# Patient Record
Sex: Male | Born: 1955 | Race: Black or African American | Hispanic: No | Marital: Single | Smoking: Former smoker
Health system: Southern US, Community
[De-identification: ages and names within clinical notes are randomized; demographics above are authoritative.]

## PROBLEM LIST (undated history)

## (undated) DIAGNOSIS — M545 Low back pain, unspecified: Secondary | ICD-10-CM

## (undated) DIAGNOSIS — G4730 Sleep apnea, unspecified: Secondary | ICD-10-CM

## (undated) DIAGNOSIS — I10 Essential (primary) hypertension: Secondary | ICD-10-CM

## (undated) DIAGNOSIS — E785 Hyperlipidemia, unspecified: Secondary | ICD-10-CM

## (undated) DIAGNOSIS — F329 Major depressive disorder, single episode, unspecified: Secondary | ICD-10-CM

## (undated) DIAGNOSIS — I714 Abdominal aortic aneurysm, without rupture, unspecified: Secondary | ICD-10-CM

## (undated) DIAGNOSIS — E0590 Thyrotoxicosis, unspecified without thyrotoxic crisis or storm: Secondary | ICD-10-CM

## (undated) DIAGNOSIS — M5126 Other intervertebral disc displacement, lumbar region: Secondary | ICD-10-CM

## (undated) HISTORY — DX: Abdominal aortic aneurysm, without rupture: I71.4

## (undated) HISTORY — PX: HERNIA REPAIR: SHX51

## (undated) HISTORY — DX: Low back pain, unspecified: M54.50

## (undated) HISTORY — DX: Essential (primary) hypertension: I10

## (undated) HISTORY — DX: Hyperlipidemia, unspecified: E78.5

## (undated) HISTORY — DX: Thyrotoxicosis, unspecified without thyrotoxic crisis or storm: E05.90

## (undated) HISTORY — DX: Low back pain: M54.5

## (undated) HISTORY — DX: Abdominal aortic aneurysm, without rupture, unspecified: I71.40

## (undated) HISTORY — DX: Major depressive disorder, single episode, unspecified: F32.9

---

## 1993-02-20 HISTORY — PX: HAND SURGERY: SHX662

## 2013-01-15 ENCOUNTER — Encounter: Payer: Self-pay | Admitting: Surgery

## 2013-02-12 ENCOUNTER — Encounter: Payer: Self-pay | Admitting: Surgery

## 2013-02-17 ENCOUNTER — Encounter: Payer: Self-pay | Admitting: Surgery

## 2013-02-17 ENCOUNTER — Ambulatory Visit (INDEPENDENT_AMBULATORY_CARE_PROVIDER_SITE_OTHER): Payer: PRIVATE HEALTH INSURANCE | Admitting: Surgery

## 2013-02-17 ENCOUNTER — Encounter (INDEPENDENT_AMBULATORY_CARE_PROVIDER_SITE_OTHER): Payer: Self-pay

## 2013-02-17 VITALS — BP 138/98 | HR 73 | Ht 72.0 in | Wt 218.3 lb

## 2013-02-17 DIAGNOSIS — I714 Abdominal aortic aneurysm, without rupture, unspecified: Secondary | ICD-10-CM

## 2013-02-17 NOTE — Progress Notes (Signed)
Patient name: Lawrence Sandoval MRN: [id number] DOB: 12-Feb-1956 Sex: male   Referred by: Dr. Venetia Night  Reason for referral:  Chief Complaint  Patient presents with  . AAA    new pt    HISTORY OF PRESENT ILLNESS: This is a very pleasant 57 year old gentleman who was referred today for evaluation of an abdominal aortic aneurysm.  This was initially detected on ultrasound during a workup for back pain.  Maximum diameter was 3.9 cm.  The patient does not endorse any abdominal pain.  The patient suffers from hypercholesterolemia and is managed with a statin.  He has hypertension which has not been well controlled because of his back pain.  He has a history of smoking but quit 10 years ago.  Past Medical History  Diagnosis Date  . Hyperlipidemia   . Hypertension   . Hyperthyroidism   . Lumbago   . Depression   . AAA (abdominal aortic aneurysm)     History reviewed. No pertinent past surgical history.  History   Social History  . Marital Status: Unknown    Spouse Name: N/A    Number of Children: N/A  . Years of Education: N/A   Occupational History  . Not on file.   Social History Main Topics  . Smoking status: Former Smoker -- 35 years    Types: Cigarettes    Quit date: 02/18/2003  . Smokeless tobacco: Never Used  . Alcohol Use: No     Comment: quit abusing alcohol about 10 years ago  . Drug Use: No  . Sexual Activity: Not on file   Other Topics Concern  . Not on file   Social History Narrative  . No narrative on file    Family History  Problem Relation Age of Onset  . Heart disease Mother   . Alcohol abuse Father   . Stroke Maternal Grandfather     Allergies as of 02/17/2013  . (No Known Allergies)    Current Outpatient Prescriptions on File Prior to Visit  Medication Sig Dispense Refill  . atenolol (TENORMIN) 50 MG tablet Take 50 mg by mouth daily.      . cetirizine (ZYRTEC) 10 MG tablet Take 10 mg by mouth daily.      . meloxicam (MOBIC) 7.5 MG  tablet Take 7.5 mg by mouth daily.      . methimazole (TAPAZOLE) 5 MG tablet Take 5 mg by mouth 3 (three) times daily.      . pravastatin (PRAVACHOL) 20 MG tablet Take 20 mg by mouth daily.       No current facility-administered medications on file prior to visit.     REVIEW OF SYSTEMS: Cardiovascular: No chest pain, chest pressure, palpitations, orthopnea, or dyspnea on exertion. No claudication or rest pain,  No history of DVT or phlebitis. Pulmonary: No productive cough, asthma or wheezing. Neurologic: Positive for numbness in his legs Hematologic: No bleeding problems or clotting disorders. Musculoskeletal: No joint pain or joint swelling. Gastrointestinal: No blood in stool or hematemesis Genitourinary: No dysuria or hematuria. Psychiatric:: No history of major depression. Integumentary: No rashes or ulcers. Constitutional: No fever or chills.  PHYSICAL EXAMINATION: General: The patient appears their stated age.  Vital signs are BP 138/98  Pulse 73  Ht 6' (1.829 m)  Wt 218 lb 4.8 oz (99.02 kg)  BMI 29.60 kg/m2  SpO2 98% HEENT:  No gross abnormalities Pulmonary: Respirations are non-labored Abdomen: Soft and non-tender  Musculoskeletal: There are no major deformities.  Neurologic: No focal weakness or paresthesias are detected, Skin: There are no ulcer or rashes noted. Psychiatric: The patient has normal affect. Cardiovascular: There is a regular rate and rhythm without significant murmur appreciated.  Extremities are warm and well perfused.  No carotid bruits.   Diagnostic Studies:  Outside ultrasound shows a 3.9 cm abdominal aortic aneurysm   Assessment:  Small abdominal aortic aneurysm Plan: I discussed the natural history of aneurysmal disease of the abdominal aorta with the patient today.  At this size, I think he is at very low risk for rupture.  I told him the importance of regular ultrasound surveillance.  He will come back to be evaluated again in my office  with an ultrasound in one year.     Jorge Ny, M.D. [facility] [place] Office: [phone] Pager:  [phone]

## 2013-02-17 NOTE — Addendum Note (Signed)
Addended by: Sharee Pimple on: 02/17/2013 02:40 PM   Modules accepted: Orders

## 2013-02-24 ENCOUNTER — Encounter: Payer: Self-pay | Admitting: Family Medicine

## 2014-02-17 ENCOUNTER — Encounter: Payer: Self-pay | Admitting: Surgery

## 2014-02-23 ENCOUNTER — Ambulatory Visit (INDEPENDENT_AMBULATORY_CARE_PROVIDER_SITE_OTHER): Payer: Medicare HMO | Admitting: Family

## 2014-02-23 ENCOUNTER — Ambulatory Visit (HOSPITAL_COMMUNITY)
Admission: RE | Admit: 2014-02-23 | Discharge: 2014-02-23 | Disposition: A | Discharge status: Home / Self Care | Payer: Medicare HMO | Source: Ambulatory Visit | Attending: Surgery | Admitting: Surgery

## 2014-02-23 ENCOUNTER — Encounter (INDEPENDENT_AMBULATORY_CARE_PROVIDER_SITE_OTHER): Payer: Self-pay

## 2014-02-23 ENCOUNTER — Encounter: Payer: Self-pay | Admitting: Family

## 2014-02-23 DIAGNOSIS — I714 Abdominal aortic aneurysm, without rupture, unspecified: Secondary | ICD-10-CM

## 2014-02-23 NOTE — Patient Instructions (Signed)
Abdominal Aortic Aneurysm An aneurysm is a weakened or damaged part of an artery wall that bulges from the normal force of blood pumping through the body. An abdominal aortic aneurysm is an aneurysm that occurs in the lower part of the aorta, the main artery of the body.  The major concern with an abdominal aortic aneurysm is that it can enlarge and burst (rupture) or blood can flow between the layers of the wall of the aorta through a tear (aorticdissection). Both of these conditions can cause bleeding inside the body and can be life threatening unless diagnosed and treated promptly. CAUSES  The exact cause of an abdominal aortic aneurysm is unknown. Some contributing factors are:   A hardening of the arteries caused by the buildup of fat and other substances in the lining of a blood vessel (arteriosclerosis).  Inflammation of the walls of an artery (arteritis).   Connective tissue diseases, such as Marfan syndrome.   Abdominal trauma.   An infection, such as syphilis or staphylococcus, in the wall of the aorta (infectious aortitis) caused by bacteria. RISK FACTORS  Risk factors that contribute to an abdominal aortic aneurysm may include:  Age older than 60 years.   High blood pressure (hypertension).  Male gender.  Ethnicity (white race).  Obesity.  Family history of aneurysm (first degree relatives only).  Tobacco use. PREVENTION  The following healthy lifestyle habits may help decrease your risk of abdominal aortic aneurysm:  Quitting smoking. Smoking can raise your blood pressure and cause arteriosclerosis.  Limiting or avoiding alcohol.  Keeping your blood pressure, blood sugar level, and cholesterol levels within normal limits.  Decreasing your salt intake. In somepeople, too much salt can raise blood pressure and increase your risk of abdominal aortic aneurysm.  Eating a diet low in saturated fats and cholesterol.  Increasing your fiber intake by including  whole grains, vegetables, and fruits in your diet. Eating these foods may help lower blood pressure.  Maintaining a healthy weight.  Staying physically active and exercising regularly. SYMPTOMS  The symptoms of abdominal aortic aneurysm may vary depending on the size and rate of growth of the aneurysm.Most grow slowly and do not have any symptoms. When symptoms do occur, they may include:  Pain (abdomen, side, lower back, or groin). The pain may vary in intensity. A sudden onset of severe pain may indicate that the aneurysm has ruptured.  Feeling full after eating only small amounts of food.  Nausea or vomiting or both.  Feeling a pulsating lump in the abdomen.  Feeling faint or passing out. DIAGNOSIS  Since most unruptured abdominal aortic aneurysms have no symptoms, they are often discovered during diagnostic exams for other conditions. An aneurysm may be found during the following procedures:  Ultrasonography (A one-time screening for abdominal aortic aneurysm by ultrasonography is also recommended for all men aged 65-75 years who have ever smoked).  X-ray exams.  A computed tomography (CT).  Magnetic resonance imaging (MRI).  Angiography or arteriography. TREATMENT  Treatment of an abdominal aortic aneurysm depends on the size of your aneurysm, your age, and risk factors for rupture. Medication to control blood pressure and pain may be used to manage aneurysms smaller than 6 cm. Regular monitoring for enlargement may be recommended by your caregiver if:  The aneurysm is 3-4 cm in size (an annual ultrasonography may be recommended).  The aneurysm is 4-4.5 cm in size (an ultrasonography every 6 months may be recommended).  The aneurysm is larger than 4.5 cm in   size (your caregiver may ask that you be examined by a vascular surgeon). If your aneurysm is larger than 6 cm, surgical repair may be recommended. There are two main methods for repair of an aneurysm:   Endovascular  repair (a minimally invasive surgery). This is done most often.  Open repair. This method is used if an endovascular repair is not possible. Document Released: 11/16/2004 Document Revised: 06/03/2012 Document Reviewed: 03/08/2012 Exit[employer] Patient Information 2015 [employer]. This information is not intended to replace advice given to you by your health care provider. Make sure you discuss any questions you have with your health care provider.  

## 2014-02-23 NOTE — Progress Notes (Signed)
[facility] [place]  Established Abdominal Aortic Aneurysm  History of Present Illness  Lawrence Sandoval is a 59 y.o. (01/28/1956) male who presents with chief complaint: follow up for AAA.  He was initially seen in December 2014 by Dr. Myra Gianotti  for evaluation of an abdominal aortic aneurysm. This was detected on ultrasound during a workup for back pain. Maximum diameter was 3.9 cm. The patient does not endorse any abdominal pain. 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 He has occasional chest and diaphragmatic area pain that is aggravated by lying down, relieved by sitting up. He states that he has had a cardiac work up and this was negative. He also states that he no longer takes meloxicam. He reports taking Nexium about 15 years ago, does not recall if it helped this pain; he will see his PCP on March 11, 2014  The patient suffers from hypercholesterolemia and is managed with a statin. He has hypertension which has not been well controlled. He has a history of smoking but quit in 2006.  The patient denies claudication in legs with walking, does have left leg radiculopathy.  The patient denies history of stroke or TIA symptoms.  Pt Diabetic: No Pt smoker: former smoker, quit about 2006 and stopped ETOH use at that time  Past Medical History  Diagnosis Date  . Hyperlipidemia   . Hypertension   . Hyperthyroidism   . Lumbago   . Depression   . AAA (abdominal aortic aneurysm)    Past Surgical History  Procedure Laterality Date  . Hand surgery Left 1995    Hand and Arm injury.    Social History History   Social History  . Marital Status: Unknown    Spouse Name: N/A    Number of Children: N/A  . Years of Education: N/A   Occupational History  . Not on file.   Social History Main Topics  . Smoking status: Former Smoker -- 35 years    Types: Cigarettes    Quit date: 02/18/2003  . Smokeless tobacco: Never Used  . Alcohol  Use: No     Comment: quit abusing alcohol about 10 years ago  . Drug Use: No  . Sexual Activity: Not on file   Other Topics Concern  . Not on file   Social History Narrative   Family History Family History  Problem Relation Age of Onset  . Heart disease Mother     CHF  . Alcohol abuse Father   . Hypertension Father   . Stroke Maternal Grandfather     Current Outpatient Prescriptions on File Prior to Visit  Medication Sig Dispense Refill  . atenolol (TENORMIN) 50 MG tablet Take 50 mg by mouth daily.    . cetirizine (ZYRTEC) 10 MG tablet Take 10 mg by mouth daily.    . methimazole (TAPAZOLE) 5 MG tablet Take 5 mg by mouth daily.     . pravastatin (PRAVACHOL) 20 MG tablet Take 20 mg by mouth daily.    . meloxicam (MOBIC) 7.5 MG tablet Take 7.5 mg by mouth daily.    . sertraline (ZOLOFT) 50 MG tablet Take 50 mg by mouth daily.      No current facility-administered medications on file prior to visit.   No Known Allergies  ROS: See HPI for pertinent positives and negatives.  Physical Examination  Filed Vitals:   02/23/14 0954  BP: 140/92  Pulse: 59  Resp: 16  Height: 6' (1.829 m)  Weight: 214 lb (97.07 kg)  SpO2: 98%   Body mass index is 29.02 kg/(m^2).  General: A&O x 3, WD.  Pulmonary: Sym exp, good air movt, CTAB, no rales, rhonchi, or wheezing.  Cardiac: RRR, Nl S1, S2, no detected murmur.   Carotid Bruits Right Left   Negative Negative   Aorta is not palpable Radial pulses are 2+ palpable.                          VASCULAR EXAM:                                                                                                         LE Pulses Right Left       FEMORAL  2+ palpable  2+ palpable        POPLITEAL  not palpable   1+ palpable       POSTERIOR TIBIAL  1+ palpable   1+ palpable        DORSALIS PEDIS      ANTERIOR TIBIAL 1+ palpable  not palpable      Gastrointestinal: soft, NTND, -G/R, - HSM, - masses palpated, - CVAT  B.  Musculoskeletal: M/S 5/5 throughout except for left lower extremity which is 4/5, Extremities without ischemic changes. Left forearm with scars and deformities from old crush injury.  Neurologic: CN 2-12 grossly intact, Pain and light touch in extremities are intact, Motor exam as listed above.  Non-Invasive Vascular Imaging  AAA Duplex (02/23/2014) ABDOMINAL AORTA DUPLEX EVALUATION    INDICATION: Abdominal aortic aneurysm    PREVIOUS INTERVENTION(S): None    DUPLEX EXAM:     LOCATION DIAMETER AP (cm) DIAMETER TRANSVERSE (cm) VELOCITIES (cm/sec)  Aorta Proximal 2.2 - 52  Aorta Mid 4.0 3.8 77  Aorta Distal 2.9 - -  Right Common Iliac Artery 1.5 1.5 87  Left Common Iliac Artery 1.6 - 106    Previous max aortic diameter:  3.9 per notes Date: 02/17/2013     ADDITIONAL FINDINGS: Bilobular mid to distal aortic aneurysm.    IMPRESSION: 1. Technically difficult exam due to excessive bowel gas. 2. 4.0 X 3.8 cm abdominal aortic aneurysm at its largest diameter.    Compared to the previous exam:  No prior duplex     Medical Decision Making  The patient is a 59 y.o. male who presents with asymptomatic AAA with no increase in size.   Based on this patient's exam and diagnostic studies, the patient will follow up in 1 year  with the following studies: AAA Duplex.  Consideration for repair of AAA would be made when the size approaches 4.8 or 5.0 cm, growth > 1 cm/yr, and symptomatic status.  I emphasized the importance of maximal medical management including strict control of blood pressure, blood glucose, and lipid levels, antiplatelet agents, obtaining regular exercise, and continued cessation of smoking.   The patient was given information about AAA including signs, symptoms, treatment, and how to minimize the risk of enlargement and rupture of aneurysms.    The  patient was advised to call 911 should the patient experience sudden onset abdominal or back pain.   Thank you for  allowing [place] to participate in this patient's care.  Charisse March, RN, MSN, FNP-C [facility] [place] Office: [phone]  Clinic Physician: Myra Gianotti  02/23/2014, 9:56 AM

## 2015-02-26 ENCOUNTER — Encounter: Payer: Self-pay | Admitting: Family

## 2015-03-08 ENCOUNTER — Ambulatory Visit (INDEPENDENT_AMBULATORY_CARE_PROVIDER_SITE_OTHER): Payer: Medicare Other | Admitting: Family

## 2015-03-08 ENCOUNTER — Encounter: Payer: Self-pay | Admitting: Family

## 2015-03-08 ENCOUNTER — Ambulatory Visit (HOSPITAL_COMMUNITY)
Admission: RE | Admit: 2015-03-08 | Discharge: 2015-03-08 | Disposition: A | Discharge status: Home / Self Care | Payer: Medicare Other | Source: Ambulatory Visit | Attending: Surgery | Admitting: Surgery

## 2015-03-08 VITALS — BP 125/91 | HR 73 | Temp 97.4°F | Ht 72.0 in | Wt 203.0 lb

## 2015-03-08 DIAGNOSIS — I714 Abdominal aortic aneurysm, without rupture, unspecified: Secondary | ICD-10-CM

## 2015-03-08 DIAGNOSIS — M5410 Radiculopathy, site unspecified: Secondary | ICD-10-CM

## 2015-03-08 DIAGNOSIS — I723 Aneurysm of iliac artery: Secondary | ICD-10-CM | POA: Insufficient documentation

## 2015-03-08 DIAGNOSIS — Z87891 Personal history of nicotine dependence: Secondary | ICD-10-CM | POA: Diagnosis not present

## 2015-03-08 NOTE — Progress Notes (Signed)
[facility] [place]  Established Abdominal Aortic Aneurysm  History of Present Illness  Lawrence Sandoval is a 60 y.o. (1955-11-12) male who presents with chief complaint: follow up for AAA.  He was initially seen in December 2014 by Dr. Myra Gianotti for evaluation of an abdominal aortic aneurysm. This was detected on ultrasound during a workup for back pain. Maximum diameter was 3.9 cm.  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 He was treated in [place] for his back pain, but not not here; he lives in [place] for the last few years.  He has occasional chest and diaphragmatic area pain that is aggravated by lying down, relieved by sitting up; otherwise no abdominal pain referable to AAA. He states that he has had a cardiac work up and this was negative. He also states that he no longer takes meloxicam. He reports taking Nexium about 15 years ago, does not recall if it helped this pain; he will see his PCP on March 11, 2014  The patient suffers from hypercholesterolemia and is managed with a statin. He has hypertension which has not been well controlled. He has a history of smoking but quit in 2006. He also has hyperthyroidism that is managed medically, he takes methimazole.   The patient denies claudication in legs with walking, does have left leg radiculopathy.  The patient denies history of stroke or TIA symptoms.  Pt Diabetic: No Pt smoker: former smoker, quit about 2006 and stopped ETOH use at that time   Past Medical History  Diagnosis Date  . Hyperlipidemia   . Hypertension   . Hyperthyroidism   . Lumbago   . Depression   . AAA (abdominal aortic aneurysm)    Past Surgical History  Procedure Laterality Date  . Hand surgery Left 1995    Hand and Arm injury.    Social History Social History   Social History  . Marital Status: Unknown    Spouse Name: N/A  . Number of Children: N/A  . Years of Education: N/A    Occupational History  . Not on file.   Social History Main Topics  . Smoking status: Former Smoker -- 35 years    Types: Cigarettes    Quit date: 02/18/2003  . Smokeless tobacco: Never Used  . Alcohol Use: No     Comment: quit abusing alcohol about 10 years ago  . Drug Use: No  . Sexual Activity: Not on file   Other Topics Concern  . Not on file   Social History Narrative   Family History Family History  Problem Relation Age of Onset  . Heart disease Mother     CHF  . Alcohol abuse Father   . Hypertension Father   . Stroke Maternal Grandfather     Current Outpatient Prescriptions on File Prior to Visit  Medication Sig Dispense Refill  . atenolol (TENORMIN) 50 MG tablet Take 50 mg by mouth daily.    . cetirizine (ZYRTEC) 10 MG tablet Take 10 mg by mouth daily.    . meloxicam (MOBIC) 7.5 MG tablet Take 7.5 mg by mouth daily.    . methimazole (TAPAZOLE) 5 MG tablet Take 5 mg by mouth daily.     . pravastatin (PRAVACHOL) 20 MG tablet Take 20 mg by mouth daily.    . sertraline (ZOLOFT) 50 MG tablet Take 50 mg by mouth daily.      No current facility-administered medications on file prior to visit.   No  Known Allergies  ROS: See HPI for pertinent positives and negatives.  Physical Examination  Filed Vitals:   03/08/15 0912  BP: 125/91  Pulse: 73  Temp: 97.4 F (36.3 C)  TempSrc: Oral  Height: 6' (1.829 m)  Weight: 203 lb (92.08 kg)  SpO2: 98%   Body mass index is 27.53 kg/(m^2).    General: A&O x 3, WD.  Pulmonary: Sym exp, good air movt, CTAB, no rales, rhonchi, or wheezing.  Cardiac: RRR, Nl S1, S2, no detected murmur.   Carotid Bruits Right Left   Negative Negative  Aorta is not palpable Radial pulses are 2+ palpable.   VASCULAR EXAM:     LE Pulses Right Left   FEMORAL 2+  palpable 2+ palpable    POPLITEAL not palpable  1+ palpable   POSTERIOR TIBIAL 1+ palpable  1+ palpable    DORSALIS PEDIS  ANTERIOR TIBIAL 2+ palpable  2+ palpable      Gastrointestinal: soft, NTND, -G/R, - HSM, - masses palpated, - CVAT B.  Musculoskeletal: M/S 5/5 throughout except for left lower extremity which is 4/5, Extremities without ischemic changes. Left forearm with scars and deformities from old crush injury.  Neurologic: CN 2-12 grossly intact, Pain and light touch in extremities are intact, Motor exam as listed above.         Non-Invasive Vascular Imaging  AAA Duplex (03/08/2015) ABDOMINAL AORTA DUPLEX EVALUATION    INDICATION: Abdominal aortic aneurysm    PREVIOUS INTERVENTION(S): None    DUPLEX EXAM:     LOCATION DIAMETER AP (cm) DIAMETER TRANSVERSE (cm) VELOCITIES (cm/sec)  Aorta Proximal 2.4 2.4 51  Aorta Mid 4.0 4.0 67  Aorta Distal 4.0 4.0 100  Right Common Iliac Artery 1.9 1.8 54  Left Common Iliac Artery 1.6 - 50    Previous max aortic diameter:  4.0 x 3.8 Date: 02/23/2014     ADDITIONAL FINDINGS:     IMPRESSION: 1. Lobulated mid to distal abdominal aortic aneurysm measuring 4.0 x 4.0 cms in both segments. 2. Aneurysmal bilateral iliac arteries, limited visualization    Compared to the previous exam:  Increase in size in right common iliac artery      Medical Decision Making  The patient is a 60 y.o. male who presents with asymptomatic AAA with no increase in size of AAA, incomplete visualization of iliac arteries, increase in diameter of right iliac artery to 1.9 cm. Fortunately his blood pressure is in good control, he does not have DM, and he stopped smoking about 2006.  Referral to Dr. Maeola HarmanJoseph Stern, neurosurgeon, for evaluation and management of known HNP, left leg radiculopathy. Pt was followed for this in [place] when he lived there, states he was told he had a ruptured disc. He has left leg  radiculopathy with considerable pain.     Based on this patient's exam and diagnostic studies, the patient will follow up in 6 months  with the following studies: AAA duplex.  Consideration for repair of AAA would be made when the size is 5.5 cm, growth > 1 cm/yr, and symptomatic status.  I emphasized the importance of maximal medical management including strict control of blood pressure, blood glucose, and lipid levels, antiplatelet agents, obtaining regular exercise, and continued cessation of smoking.   The patient was given information about AAA including signs, symptoms, treatment, and how to minimize the risk of enlargement and rupture of aneurysms.    The patient was advised to call 911 should the patient experience sudden onset abdominal or  back pain.   Thank you for allowing [place] to participate in this patient's care.  Charisse March, RN, MSN, FNP-C [facility] [place] Office: [phone]  Clinic Physician: Myra Gianotti  03/08/2015, 9:10 AM

## 2015-03-08 NOTE — Patient Instructions (Signed)
Abdominal Aortic Aneurysm An aneurysm is a weakened or damaged part of an artery wall that bulges from the normal force of blood pumping through the body. An abdominal aortic aneurysm is an aneurysm that occurs in the lower part of the aorta, the main artery of the body.  The major concern with an abdominal aortic aneurysm is that it can enlarge and burst (rupture) or blood can flow between the layers of the wall of the aorta through a tear (aorticdissection). Both of these conditions can cause bleeding inside the body and can be life threatening unless diagnosed and treated promptly. CAUSES  The exact cause of an abdominal aortic aneurysm is unknown. Some contributing factors are:   A hardening of the arteries caused by the buildup of fat and other substances in the lining of a blood vessel (arteriosclerosis).  Inflammation of the walls of an artery (arteritis).   Connective tissue diseases, such as Marfan syndrome.   Abdominal trauma.   An infection, such as syphilis or staphylococcus, in the wall of the aorta (infectious aortitis) caused by bacteria. RISK FACTORS  Risk factors that contribute to an abdominal aortic aneurysm may include:  Age older than 60 years.   High blood pressure (hypertension).  Male gender.  Ethnicity (white race).  Obesity.  Family history of aneurysm (first degree relatives only).  Tobacco use. PREVENTION  The following healthy lifestyle habits may help decrease your risk of abdominal aortic aneurysm:  Quitting smoking. Smoking can raise your blood pressure and cause arteriosclerosis.  Limiting or avoiding alcohol.  Keeping your blood pressure, blood sugar level, and cholesterol levels within normal limits.  Decreasing your salt intake. In somepeople, too much salt can raise blood pressure and increase your risk of abdominal aortic aneurysm.  Eating a diet low in saturated fats and cholesterol.  Increasing your fiber intake by including  whole grains, vegetables, and fruits in your diet. Eating these foods may help lower blood pressure.  Maintaining a healthy weight.  Staying physically active and exercising regularly. SYMPTOMS  The symptoms of abdominal aortic aneurysm may vary depending on the size and rate of growth of the aneurysm.Most grow slowly and do not have any symptoms. When symptoms do occur, they may include:  Pain (abdomen, side, lower back, or groin). The pain may vary in intensity. A sudden onset of severe pain may indicate that the aneurysm has ruptured.  Feeling full after eating only small amounts of food.  Nausea or vomiting or both.  Feeling a pulsating lump in the abdomen.  Feeling faint or passing out. DIAGNOSIS  Since most unruptured abdominal aortic aneurysms have no symptoms, they are often discovered during diagnostic exams for other conditions. An aneurysm may be found during the following procedures:  Ultrasonography (A one-time screening for abdominal aortic aneurysm by ultrasonography is also recommended for all men aged 65-75 years who have ever smoked).  X-ray exams.  A computed tomography (CT).  Magnetic resonance imaging (MRI).  Angiography or arteriography. TREATMENT  Treatment of an abdominal aortic aneurysm depends on the size of your aneurysm, your age, and risk factors for rupture. Medication to control blood pressure and pain may be used to manage aneurysms smaller than 6 cm. Regular monitoring for enlargement may be recommended by your caregiver if:  The aneurysm is 3-4 cm in size (an annual ultrasonography may be recommended).  The aneurysm is 4-4.5 cm in size (an ultrasonography every 6 months may be recommended).  The aneurysm is larger than 4.5 cm in   size (your caregiver may ask that you be examined by a vascular surgeon). If your aneurysm is larger than 6 cm, surgical repair may be recommended. There are two main methods for repair of an aneurysm:   Endovascular  repair (a minimally invasive surgery). This is done most often.  Open repair. This method is used if an endovascular repair is not possible.   This information is not intended to replace advice given to you by your health care provider. Make sure you discuss any questions you have with your health care provider.   Document Released: 11/16/2004 Document Revised: 06/03/2012 Document Reviewed: 03/08/2012 Elsevier Interactive Patient Education 2016 Elsevier Inc.  

## 2015-03-08 NOTE — Addendum Note (Signed)
Addended by: Adria DillELDRIDGE-LEWIS, Keontre Defino L on: 03/08/2015 12:04 PM   Modules accepted: Orders

## 2015-05-12 DIAGNOSIS — Z86010 Personal history of colonic polyps: Secondary | ICD-10-CM | POA: Insufficient documentation

## 2015-05-21 ENCOUNTER — Encounter: Payer: Self-pay | Admitting: Neurosurgery

## 2015-09-02 ENCOUNTER — Encounter: Payer: Self-pay | Admitting: Family

## 2015-09-06 ENCOUNTER — Ambulatory Visit (HOSPITAL_COMMUNITY)
Admission: RE | Admit: 2015-09-06 | Discharge: 2015-09-06 | Disposition: A | Discharge status: Home / Self Care | Payer: Medicare Other | Source: Ambulatory Visit | Attending: Family | Admitting: Family

## 2015-09-06 ENCOUNTER — Encounter: Payer: Self-pay | Admitting: Family

## 2015-09-06 ENCOUNTER — Ambulatory Visit (INDEPENDENT_AMBULATORY_CARE_PROVIDER_SITE_OTHER): Payer: Medicare Other | Admitting: Family

## 2015-09-06 VITALS — BP 122/80 | HR 58 | Temp 98.1°F | Resp 14 | Ht 72.0 in | Wt 188.0 lb

## 2015-09-06 DIAGNOSIS — I714 Abdominal aortic aneurysm, without rupture, unspecified: Secondary | ICD-10-CM

## 2015-09-06 DIAGNOSIS — Z87891 Personal history of nicotine dependence: Secondary | ICD-10-CM

## 2015-09-06 DIAGNOSIS — F329 Major depressive disorder, single episode, unspecified: Secondary | ICD-10-CM | POA: Insufficient documentation

## 2015-09-06 DIAGNOSIS — M5410 Radiculopathy, site unspecified: Secondary | ICD-10-CM | POA: Insufficient documentation

## 2015-09-06 DIAGNOSIS — E785 Hyperlipidemia, unspecified: Secondary | ICD-10-CM | POA: Diagnosis not present

## 2015-09-06 DIAGNOSIS — Z7722 Contact with and (suspected) exposure to environmental tobacco smoke (acute) (chronic): Secondary | ICD-10-CM | POA: Diagnosis not present

## 2015-09-06 DIAGNOSIS — I723 Aneurysm of iliac artery: Secondary | ICD-10-CM | POA: Diagnosis not present

## 2015-09-06 DIAGNOSIS — I10 Essential (primary) hypertension: Secondary | ICD-10-CM | POA: Diagnosis not present

## 2015-09-06 NOTE — Patient Instructions (Addendum)
Abdominal Aortic Aneurysm An aneurysm is a weakened or damaged part of an artery wall that bulges from the normal force of blood pumping through the body. An abdominal aortic aneurysm is an aneurysm that occurs in the lower part of the aorta, the main artery of the body.  The major concern with an abdominal aortic aneurysm is that it can enlarge and burst (rupture) or blood can flow between the layers of the wall of the aorta through a tear (aorticdissection). Both of these conditions can cause bleeding inside the body and can be life threatening unless diagnosed and treated promptly. CAUSES  The exact cause of an abdominal aortic aneurysm is unknown. Some contributing factors are:   A hardening of the arteries caused by the buildup of fat and other substances in the lining of a blood vessel (arteriosclerosis).  Inflammation of the walls of an artery (arteritis).   Connective tissue diseases, such as Marfan syndrome.   Abdominal trauma.   An infection, such as syphilis or staphylococcus, in the wall of the aorta (infectious aortitis) caused by bacteria. RISK FACTORS  Risk factors that contribute to an abdominal aortic aneurysm may include:  Age older than 60 years.   High blood pressure (hypertension).  Male gender.  Ethnicity (white race).  Obesity.  Family history of aneurysm (first degree relatives only).  Tobacco use. PREVENTION  The following healthy lifestyle habits may help decrease your risk of abdominal aortic aneurysm:  Quitting smoking. Smoking can raise your blood pressure and cause arteriosclerosis.  Limiting or avoiding alcohol.  Keeping your blood pressure, blood sugar level, and cholesterol levels within normal limits.  Decreasing your salt intake. In somepeople, too much salt can raise blood pressure and increase your risk of abdominal aortic aneurysm.  Eating a diet low in saturated fats and cholesterol.  Increasing your fiber intake by including  whole grains, vegetables, and fruits in your diet. Eating these foods may help lower blood pressure.  Maintaining a healthy weight.  Staying physically active and exercising regularly. SYMPTOMS  The symptoms of abdominal aortic aneurysm may vary depending on the size and rate of growth of the aneurysm.Most grow slowly and do not have any symptoms. When symptoms do occur, they may include:  Pain (abdomen, side, lower back, or groin). The pain may vary in intensity. A sudden onset of severe pain may indicate that the aneurysm has ruptured.  Feeling full after eating only small amounts of food.  Nausea or vomiting or both.  Feeling a pulsating lump in the abdomen.  Feeling faint or passing out. DIAGNOSIS  Since most unruptured abdominal aortic aneurysms have no symptoms, they are often discovered during diagnostic exams for other conditions. An aneurysm may be found during the following procedures:  Ultrasonography (A one-time screening for abdominal aortic aneurysm by ultrasonography is also recommended for all men aged 65-75 years who have ever smoked).  X-ray exams.  A computed tomography (CT).  Magnetic resonance imaging (MRI).  Angiography or arteriography. TREATMENT  Treatment of an abdominal aortic aneurysm depends on the size of your aneurysm, your age, and risk factors for rupture. Medication to control blood pressure and pain may be used to manage aneurysms smaller than 6 cm. Regular monitoring for enlargement may be recommended by your caregiver if:  The aneurysm is 3-4 cm in size (an annual ultrasonography may be recommended).  The aneurysm is 4-4.5 cm in size (an ultrasonography every 6 months may be recommended).  The aneurysm is larger than 4.5 cm in   size (your caregiver may ask that you be examined by a vascular surgeon). If your aneurysm is larger than 6 cm, surgical repair may be recommended. There are two main methods for repair of an aneurysm:   Endovascular  repair (a minimally invasive surgery). This is done most often.  Open repair. This method is used if an endovascular repair is not possible.   This information is not intended to replace advice given to you by your health care provider. Make sure you discuss any questions you have with your health care provider.   Document Released: 11/16/2004 Document Revised: 06/03/2012 Document Reviewed: 03/08/2012 Elsevier Interactive Patient Education 2016 Elsevier Inc.    Before your next abdominal ultrasound:  Take two Extra-Strength Gas-X capsules at bedtime the night before the test. Take another two Extra-Strength Gas-X capsules 3 hours before the test.   

## 2015-09-06 NOTE — Progress Notes (Signed)
[facility] [place]   CC: Follow up Abdominal Aortic Aneurysm  History of Present Illness  Lawrence Sandoval is a 60 y.o. (Jul 14, 1955) male who presents with chief complaint: follow up for AAA.  He was initially seen in December 2014 by Dr. Myra GianottiBrabham for evaluation of an abdominal aortic aneurysm. This was detected on ultrasound during a workup for back pain. Maximum diameter was 3.9 cm.  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 He was treated in [place] for his back pain, but not not here; [place] in [place] the last few years. He was evaluated by Dr. Venetia MaxonStern in March 2017 for his lumbar disc disease, pt indicates that surgery was not recommended.   He has occasional chest and diaphragmatic area pain that is aggravated by lying down, relieved by sitting up; otherwise no abdominal pain referable to AAA. He states that he has had a cardiac work up and this was negative.  The patient suffers from hypercholesterolemia and is managed with a statin. He has hypertension which has not been well controlled. He has a history of smoking but quit in 2006. He also has hyperthyroidism that is managed medically, he takes methimazole.   The patient denies claudication in legs with walking, does have left leg radiculopathy.  The patient denies history of stroke or TIA symptoms.  Pt Diabetic: No Pt smoker: former smoker, quit about 2006 and stopped ETOH use at that time, but he is exposed to second hand smoke from he lady friend   Past Medical History  Diagnosis Date  . Hyperlipidemia   . Hypertension   . Hyperthyroidism   . Lumbago   . Depression   . AAA (abdominal aortic aneurysm) [facility](HCC)    Past Surgical History  Procedure Laterality Date  . Hand surgery Left 1995    Hand and Arm injury.    Social History Social History   Social History  . Marital Status: Unknown    Spouse Name: N/A  . Number of Children: N/A  . Years of  Education: N/A   Occupational History  . Not on file.   Social History Main Topics  . Smoking status: Former Smoker -- 35 years    Types: Cigarettes    Quit date: 02/18/2003  . Smokeless tobacco: Never Used  . Alcohol Use: No     Comment: quit abusing alcohol about 10 years ago  . Drug Use: No  . Sexual Activity: Not on file   Other Topics Concern  . Not on file   Social History Narrative   Family History Family History  Problem Relation Age of Onset  . Heart disease Mother     CHF  . Alcohol abuse Father   . Hypertension Father   . Stroke Maternal Grandfather     Current Outpatient Prescriptions on File Prior to Visit  Medication Sig Dispense Refill  . atenolol (TENORMIN) 50 MG tablet Take 50 mg by mouth daily.    . methimazole (TAPAZOLE) 5 MG tablet Take 5 mg by mouth daily.     . pravastatin (PRAVACHOL) 20 MG tablet Take 20 mg by mouth daily.    . cetirizine (ZYRTEC) 10 MG tablet Take 10 mg by mouth daily. Reported on 09/06/2015    . meloxicam (MOBIC) 7.5 MG tablet Take 7.5 mg by mouth daily. Reported on 09/06/2015    . sertraline (ZOLOFT) 50 MG tablet Take 50 mg by mouth daily. Reported on 09/06/2015  No current facility-administered medications on file prior to visit.   No Known Allergies  ROS: See HPI for pertinent positives and negatives.  Physical Examination  Filed Vitals:   09/06/15 0901  BP: 122/80  Pulse: 58  Temp: 98.1 F (36.7 C)  TempSrc: Oral  Resp: 14  Height: 6' (1.829 m)  Weight: 188 lb (85.276 kg)  SpO2: 98%   Body mass index is 25.49 kg/(m^2).  General: A&O x 3, WD.  Pulmonary: Sym exp, good air movt, respirations are non labored, CTAB Cardiac: RRR, Nl S1, S2, no detected murmur.   Carotid Bruits Right Left   Negative Negative  Aorta is not palpable Radial pulses are 2+ palpable.   VASCULAR EXAM:      LE Pulses Right Left   FEMORAL 2+ palpable 2+ palpable    POPLITEAL not palpable  1+ palpable   POSTERIOR TIBIAL 1+ palpable  1+ palpable    DORSALIS PEDIS  ANTERIOR TIBIAL 2+ palpable  2+ palpable      Gastrointestinal: soft, NTND, -G/R, - HSM, - masses palpated, - CVAT B.  Musculoskeletal: M/S 5/5 throughout except for left lower extremity which is 4/5, Extremities without ischemic changes. Left forearm with scars and deformities from old crush injury.  Neurologic: CN 2-12 grossly intact, Pain and light touch in extremities are intact, Motor exam as listed above.              05/05/15 MR lumbar spine requested by Dr. Venetia Maxon: Bilobed abdominal aortic aneurysm rising just below renal arteries: 4.2 cm and 4.4 cm maximum diameters. Right CIA: 2.3 cm Left CIA: 2.0 cm   Non-Invasive Vascular Imaging  AAA Duplex (09/06/2015) Previous size: 4.4 cm (Date: 05/05/15 MR), Right CIA: 2.3 cm, Left CIA: 2.0 cm Current size:  4.3 cm (Date: 09/06/2015), Right CIA: 2.4 cm, Left CIA: 2.1 cm Decreased visualization of the abdominal vasculature due to overlying bowel gas.  Medical Decision Making  The patient is a 60 y.o. male who presents with asymptomatic AAA with no increase in size compared to MR of 05/05/15. 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 Fortunately his blood pressure is in good control, he does not have DM, and he stopped smoking about 2006. Unfortunately he is regularly exposed to secondhand smoke from his male friend.   Based on this patient's exam and diagnostic studies, the patient will follow up in 6 months  with the following studies: AAA duplex and see me afterward on a day that Dr. Myra Gianotti is in the office.  Consideration for repair of AAA would be made when the size is 5.5 cm, growth > 1 cm/yr,  and symptomatic status.  I emphasized the importance of maximal medical management including strict control of blood pressure, blood glucose, and lipid levels, antiplatelet agents, obtaining regular exercise, and cessation of exposure to secondhand smoke.   The patient was given information about AAA including signs, symptoms, treatment, and how to minimize the risk of enlargement and rupture of aneurysms.    The patient was advised to call 911 should the patient experience sudden onset abdominal or back pain.   Thank you for allowing [place] to participate in this patient's care.  Charisse March, RN, MSN, FNP-C [facility] [place] Office: [phone]  Clinic Physician: Myra Gianotti  09/06/2015, 9:27 AM

## 2015-10-12 DIAGNOSIS — M5416 Radiculopathy, lumbar region: Secondary | ICD-10-CM | POA: Insufficient documentation

## 2016-01-06 ENCOUNTER — Other Ambulatory Visit: Payer: Self-pay | Admitting: *Deleted

## 2016-01-06 DIAGNOSIS — I714 Abdominal aortic aneurysm, without rupture, unspecified: Secondary | ICD-10-CM

## 2016-03-07 ENCOUNTER — Encounter: Payer: Self-pay | Admitting: Family

## 2016-03-09 ENCOUNTER — Ambulatory Visit (HOSPITAL_COMMUNITY): Payer: Medicare Other

## 2016-03-09 ENCOUNTER — Ambulatory Visit: Payer: Medicare Other | Admitting: Family

## 2016-04-11 DIAGNOSIS — J301 Allergic rhinitis due to pollen: Secondary | ICD-10-CM | POA: Insufficient documentation

## 2016-04-14 ENCOUNTER — Encounter: Payer: Self-pay | Admitting: Family

## 2016-04-20 ENCOUNTER — Ambulatory Visit (HOSPITAL_COMMUNITY)
Admission: RE | Admit: 2016-04-20 | Discharge: 2016-04-20 | Disposition: A | Discharge status: Home / Self Care | Payer: Medicare Other | Source: Ambulatory Visit | Attending: Family | Admitting: Family

## 2016-04-20 ENCOUNTER — Ambulatory Visit (INDEPENDENT_AMBULATORY_CARE_PROVIDER_SITE_OTHER): Payer: Medicare Other | Admitting: Family

## 2016-04-20 ENCOUNTER — Encounter: Payer: Self-pay | Admitting: Family

## 2016-04-20 VITALS — BP 128/93 | HR 68 | Temp 97.7°F | Resp 18 | Ht 72.0 in | Wt 192.0 lb

## 2016-04-20 DIAGNOSIS — I714 Abdominal aortic aneurysm, without rupture, unspecified: Secondary | ICD-10-CM

## 2016-04-20 DIAGNOSIS — Z87891 Personal history of nicotine dependence: Secondary | ICD-10-CM

## 2016-04-20 DIAGNOSIS — Z7722 Contact with and (suspected) exposure to environmental tobacco smoke (acute) (chronic): Secondary | ICD-10-CM | POA: Diagnosis not present

## 2016-04-20 NOTE — Patient Instructions (Addendum)
Abdominal Aortic Aneurysm Blood pumps away from the heart through tubes (blood vessels) called arteries. Aneurysms are weak or damaged places in the wall of an artery. It bulges out like a balloon. An abdominal aortic aneurysm happens in the main artery of the body (aorta). It can burst or tear, causing bleeding inside the body. This is an emergency. It needs treatment right away. What are the causes? The exact cause is unknown. Things that could cause this problem include:  Fat and other substances building up in the lining of a tube.  Swelling of the walls of a blood vessel.  Certain tissue diseases.  Belly (abdominal) trauma.  An infection in the main artery of the body. What increases the risk? There are things that make it more likely for you to have an aneurysm. These include:  Being over the age of 60 years old.  Having high blood pressure (hypertension).  Being a male.  Being white.  Being very overweight (obese).  Having a family history of aneurysm.  Using tobacco products. What are the signs or symptoms? Symptoms depend on the size of the aneurysm and how fast it grows. There may not be symptoms. If symptoms occur, they can include:  Pain (belly, side, lower back, or groin).  Feeling full after eating a small amount of food.  Feeling sick to your stomach (nauseous), throwing up (vomiting), or both.  Feeling a lump in your belly that feels like it is beating (pulsating).  Feeling like you will pass out (faint). How is this treated?  Medicine to control blood pressure and pain.  Imaging tests to see if the aneurysm gets bigger.  Surgery. How is this prevented? To lessen your chance of getting this condition:  Stop smoking. Stop chewing tobacco.  Limit or avoid alcohol.  Keep your blood pressure, blood sugar, and cholesterol within normal limits.  Eat less salt.  Eat foods low in saturated fats and cholesterol. These are found in animal and whole  dairy products.  Eat more fiber. Fiber is found in whole grains, vegetables, and fruits.  Keep a healthy weight.  Stay active and exercise often. This information is not intended to replace advice given to you by your health care provider. Make sure you discuss any questions you have with your health care provider. Document Released: 06/03/2012 Document Revised: 07/15/2015 Document Reviewed: 03/08/2012 Elsevier Interactive Patient Education  2017 Elsevier Inc.    Before your next abdominal ultrasound:  Take two Extra-Strength Gas-X capsules at bedtime the night before the test. Take another two Extra-Strength Gas-X capsules 3 hours before the test.       Secondhand Smoke What is secondhand smoke? Secondhand smoke is smoke that comes from burning tobacco. It could be the smoke from a cigarette, a pipe, or a cigar. Even if you are not the one smoking, secondhand smoke exposes you to the dangers of smoking. This is called involuntary, or passive, smoking. There are two types of secondhand smoke:  Sidestream smoke is the smoke that comes off the lighted end of a cigarette, pipe, or cigar.  This type of smoke has the highest amount of cancer-causing agents (carcinogens).  The particles in sidestream smoke are smaller. They get into your lungs more easily.  Mainstream smoke is the smoke that is exhaled by a person who is smoking.  This type of smoke is also dangerous to your health. How can secondhand smoke affect my health? Studies show that there is no safe level of secondhand smoke.   This smoke contains thousands of chemicals. At least 69 of them are known to cause cancer. Secondhand smoke can also cause many other health problems. It has been linked to:  Lung cancer.  Cancer of the voice box (larynx) or throat.  Cancer of the sinuses.  Brain cancer.  Bladder cancer.  Stomach cancer.  Breast cancer.  White blood cell cancers (lymphoma and leukemia).  Brain and liver  tumors in children.  Heart disease and stroke in adults.  Pregnancy loss (miscarriage).  Diseases in children, such as:  Asthma.  Lung infections.  Ear infections.  Sudden infant death syndrome (SIDS).  Slow growth. Where can I be at risk for exposure to secondhand smoke?  For adults, the workplace is the main source of exposure to secondhand smoke.  Your workplace should have a policy separating smoking areas from nonsmoking areas.  Smoking areas should have a system for ventilating and cleaning the air.  For children, the home may be the most dangerous place for exposure to secondhand smoke.  Children who live in apartment buildings may be at risk from smoke drifting from hallways or other people's homes.  For everyone, many public places are possible sources of exposure to secondhand smoke.  These places include restaurants, shopping centers, and parks. How can I reduce my risk for exposure to secondhand smoke? The most important thing you can do is not smoke. Discourage family members from smoking. Other ways to reduce exposure for you and your family include the following:  Keep your home smoke free.  Make sure your child care providers do not smoke.  Warn your child about the dangers of smoking and secondhand smoke.  Do not allow smoking in your car. When someone smokes in a car, all the damaging chemicals from the smoke are confined in a small area.  Avoid public places where smoking is allowed. This information is not intended to replace advice given to you by your health care provider. Make sure you discuss any questions you have with your health care provider. Document Released: 03/16/2004 Document Revised: 01/04/2016 Document Reviewed: 05/23/2013 Elsevier Interactive Patient Education  2017 Elsevier Inc.  

## 2016-04-20 NOTE — Progress Notes (Signed)
+ [facility] [place]   CC: Follow up Abdominal Aortic Aneurysm  History of Present Illness  Lawrence Sandoval is a 61 y.o. (11-16-1955) male who presents with chief complaint: follow up for AAA.  He was initially seen in December 2014 by Dr. Myra Gianotti for evaluation of an abdominal aortic aneurysm. This was detected on ultrasound during a workup for back pain. Maximum diameter was 3.9 cm.  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 He was treated in [place] for his back pain, but not not here; he lives in [place] for the last few years. He was evaluated by Dr. Venetia Maxon in March 2017 for his lumbar disc disease, pt indicates that surgery was not recommended.   He has occasional chest and diaphragmatic area pain that is aggravated by lying down, relieved by sitting up; otherwise no abdominal pain referable to AAA. He states that he has had a cardiac work up and this was negative.  The patient suffers from hypercholesterolemia and is managed with a statin. He has hypertension which has not been well controlled. He has a history of smoking but quit in 2006. He also has hyperthyroidism that is managed medically, he takes methimazole.   The patient denies claudication in legs with walking, does have left leg radiculopathy.  The patient denies history of stroke or TIA symptoms.  Pt Diabetic: No Pt smoker: former smoker, quit about 2006 and stopped ETOH use at that time, but he is exposed to second hand smoke from his lady friend   Past Medical History:  Diagnosis Date  . AAA (abdominal aortic aneurysm) (HCC)   . Depression   . Hyperlipidemia   . Hypertension   . Hyperthyroidism   . Lumbago    Past Surgical History:  Procedure Laterality Date  . HAND SURGERY Left 1995   Hand and Arm injury.    Social History Social History   Social History  . Marital status: Unknown    Spouse name: N/A  . Number of children: N/A  . Years  of education: N/A   Occupational History  . Not on file.   Social History Main Topics  . Smoking status: Former Smoker    Years: 35.00    Types: Cigarettes    Quit date: 02/18/2003  . Smokeless tobacco: Never Used  . Alcohol use No     Comment: quit abusing alcohol about 10 years ago  . Drug use: No  . Sexual activity: Not on file   Other Topics Concern  . Not on file   Social History Narrative  . No narrative on file   Family History Family History  Problem Relation Age of Onset  . Heart disease Mother     CHF  . Alcohol abuse Father   . Hypertension Father   . Stroke Maternal Grandfather     Current Outpatient Prescriptions on File Prior to Visit  Medication Sig Dispense Refill  . atenolol (TENORMIN) 50 MG tablet Take 50 mg by mouth daily.    . methimazole (TAPAZOLE) 5 MG tablet Take 5 mg by mouth daily.     . pravastatin (PRAVACHOL) 20 MG tablet Take 20 mg by mouth daily.    . cetirizine (ZYRTEC) 10 MG tablet Take 10 mg by mouth daily. Reported on 09/06/2015    . meloxicam (MOBIC) 7.5 MG tablet Take 7.5 mg by mouth daily. Reported on 09/06/2015    . sertraline (ZOLOFT) 50 MG tablet Take 50 mg by mouth  daily. Reported on 09/06/2015     No current facility-administered medications on file prior to visit.    No Known Allergies  ROS: See HPI for pertinent positives and negatives.  Physical Examination  Vitals:   04/20/16 0840  BP: (!) 128/93  Pulse: 68  Resp: 18  Temp: 97.7 F (36.5 C)  TempSrc: Oral  SpO2: 96%  Weight: 192 lb (87.1 kg)  Height: 6' (1.829 m)   Body mass index is 26.04 kg/m.  General: A&O x 3, WD.  Pulmonary: Sym exp, good air movt, respirations are non labored, CTAB Cardiac: RRR, Nl S1, S2, no detected murmur.   Carotid Bruits Right Left   Negative Negative  Aorta is not palpable Radial pulses are 2+ palpable.   VASCULAR EXAM:      LE Pulses Right Left   FEMORAL 2+ palpable 2+ palpable    POPLITEAL not palpable  1+ palpable   POSTERIOR TIBIAL faintly palpable  faintly palpable    DORSALIS PEDIS  ANTERIOR TIBIAL 2+ palpable  2+ palpable      Gastrointestinal: soft, NTND, -G/R, - HSM, - masses palpated, - CVAT B.  Musculoskeletal: M/S 5/5 throughout except for left lower extremity which is 4/5, Extremities without ischemic changes. Left forearm with scars and deformities from old crush injury.  Neurologic: CN 2-12 grossly intact, Pain and light touch in extremities are intact, Motor exam as listed above.     MRI Lumbar Spine requested by Dr. Venetia Maxon (05-05-15): AAA: 4.4 cm; Right CIA: 2.3 cm; Left CIA: 2.0 cm  Non-Invasive Vascular Imaging  AAA Duplex (04/20/2016)  Previous size: 4.3 cm (Date: 09/06/2015), Right CIA: 2.4 cm, Left CIA: 2.1 cm  Current size:  4.3 cm (Date: 04-20-2016), Right CIA: 2.4 cm; Left CIA: 2.3 cm, based on limited visualization due to overlying bowel gas.   Medical Decision Making  The patient is a 61 y.o. male with asymptomatic AAA with no increase in size compared to MR of 05/05/15. 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 Fortunately his blood pressure is in good control, he does not have DM, and he stopped smoking about 2006. Unfortunately he is regularly exposed to secondhand smoke from his male friend.  He tried Gas-X, states it nauseated him too badly, did not take it prior to this duplex and overlying gas of the abdominal vasculature decreased the visualization.   Last serum creatinine result on file was 0.95 on 12-27-12.    Based on this patient's exam and diagnostic studies, and after consulting Dr. Darrick Penna and Olegario Messier Comfort, ultrasonographer, the patient will follow up in 6 months  with the following  studies: AAA duplex and see me afterward.  Consideration for repair of AAA would be made when the size is 5.0 cm, growth > 1 cm/yr, and symptomatic status.       Consideration for repair of common iliac artery aneurysm would be made when the size is 3.0 cm or greater.   I emphasized the importance of maximal medical management including strict control of blood pressure, blood glucose, and lipid levels, antiplatelet agents, obtaining regular exercise, and continued cessation of smoking.   The patient was given information about AAA including signs, symptoms, treatment, and how to minimize the risk of enlargement and rupture of aneurysms.    The patient was advised to call 911 should the patient experience sudden onset abdominal or back pain.   Thank you for allowing [place] to participate in this patient's care.  Charisse March, RN, MSN, FNP-C [facility] [place] Office: [phone]  Clinic Physician: Darrick PennaFields  04/20/2016, 9:07 AM

## 2016-04-28 NOTE — Addendum Note (Signed)
Addended by: Burton ApleyPETTY, Zamia Tyminski A on: 04/28/2016 09:26 AM   Modules accepted: Orders

## 2016-10-10 DIAGNOSIS — Z8659 Personal history of other mental and behavioral disorders: Secondary | ICD-10-CM | POA: Insufficient documentation

## 2016-10-10 DIAGNOSIS — R310 Gross hematuria: Secondary | ICD-10-CM | POA: Insufficient documentation

## 2016-10-18 ENCOUNTER — Encounter: Payer: Self-pay | Admitting: Family

## 2016-10-30 ENCOUNTER — Ambulatory Visit (HOSPITAL_COMMUNITY)
Admission: RE | Admit: 2016-10-30 | Discharge: 2016-10-30 | Disposition: A | Discharge status: Home / Self Care | Payer: Medicare Other | Source: Ambulatory Visit | Attending: Family | Admitting: Family

## 2016-10-30 ENCOUNTER — Ambulatory Visit (INDEPENDENT_AMBULATORY_CARE_PROVIDER_SITE_OTHER): Payer: Medicare Other | Admitting: Family

## 2016-10-30 ENCOUNTER — Encounter: Payer: Self-pay | Admitting: Family

## 2016-10-30 VITALS — BP 145/99 | HR 58 | Temp 97.1°F | Resp 16 | Ht 72.0 in | Wt 182.0 lb

## 2016-10-30 DIAGNOSIS — I714 Abdominal aortic aneurysm, without rupture, unspecified: Secondary | ICD-10-CM

## 2016-10-30 DIAGNOSIS — Z87891 Personal history of nicotine dependence: Secondary | ICD-10-CM

## 2016-10-30 NOTE — Progress Notes (Signed)
[facility] [place]   CC: Follow up Abdominal Aortic Aneurysm  History of Present Illness  Lawrence Sandoval is a 61 y.o. (06-08-1955) male who presents with chief complaint: follow up for AAA.  He was initially seen in December 2014 by Dr. Myra Gianotti for evaluation of an abdominal aortic aneurysm. This was detected on ultrasound during a workup for back pain. Maximum diameter was 3.9 cm.  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 He states his left toes remain numb.  He was treated in [place] for his back pain, but not not here; he lives in [place] for the last few years. He was evaluated by Dr. Venetia Maxon in March 2017 for his lumbar disc disease, pt indicates that surgery was not recommended.   He has occasional chest and diaphragmatic area pain that is aggravated by lying down, relieved by sitting up; otherwise no abdominal pain referable to AAA. He states that he has had a cardiac work up and this was negative.  The patient suffers from hypercholesterolemia and is managed with a statin. He has hypertension which has not been well controlled. He has a history of smoking but quit in 2006. He also has hyperthyroidism that is managed medically, he takes methimazole.   The patient denies claudication in legs with walking, does have left leg radiculopathy.  The patient denies history of stroke or TIA symptoms.  He states his systolic blood pressure is usually in the 130's.   Pt Diabetic: No Pt smoker: former smoker, quit about 2006 and stopped ETOH use at that time.   Past Medical History:  Diagnosis Date  . AAA (abdominal aortic aneurysm) (HCC)   . Depression   . Hyperlipidemia   . Hypertension   . Hyperthyroidism   . Lumbago    Past Surgical History:  Procedure Laterality Date  . HAND SURGERY Left 1995   Hand and Arm injury.    Social History Social History   Social History  . Marital status: Unknown    Spouse name: N/A   . Number of children: N/A  . Years of education: N/A   Occupational History  . Not on file.   Social History Main Topics  . Smoking status: Former Smoker    Years: 35.00    Types: Cigarettes    Quit date: 02/18/2003  . Smokeless tobacco: Never Used  . Alcohol use No     Comment: quit abusing alcohol about 10 years ago  . Drug use: No  . Sexual activity: Not on file   Other Topics Concern  . Not on file   Social History Narrative  . No narrative on file   Family History Family History  Problem Relation Age of Onset  . Heart disease Mother        CHF  . Alcohol abuse Father   . Hypertension Father   . Stroke Maternal Grandfather     Current Outpatient Prescriptions on File Prior to Visit  Medication Sig Dispense Refill  . atenolol (TENORMIN) 50 MG tablet Take 50 mg by mouth daily.    . methimazole (TAPAZOLE) 5 MG tablet Take 5 mg by mouth daily.     . pravastatin (PRAVACHOL) 20 MG tablet Take 20 mg by mouth daily.    . cetirizine (ZYRTEC) 10 MG tablet Take 10 mg by mouth daily. Reported on 09/06/2015    . meloxicam (MOBIC) 7.5 MG tablet Take 7.5 mg by mouth daily. Reported on 09/06/2015    .  sertraline (ZOLOFT) 50 MG tablet Take 50 mg by mouth daily. Reported on 09/06/2015     No current facility-administered medications on file prior to visit.    No Known Allergies  ROS: See HPI for pertinent positives and negatives.  Physical Examination  Vitals:   10/30/16 0852 10/30/16 0855  BP: (!) 152/97 (!) 145/99  Pulse: 68 (!) 58  Resp: 16   Temp: (!) 97.1 F (36.2 C)   Weight: 182 lb (82.6 kg)   Height: 6' (1.829 m)    Body mass index is 24.68 kg/m.  General: A&O x 3, WD.  Pulmonary: Sym exp, good air movt, respirations are non labored, CTAB Cardiac: RRR, Nl S1, S2, no detected murmur.   Carotid Bruits Right Left   Negative Negative  Aorta is not palpable Radial pulses are 2+ palpable.   VASCULAR  EXAM:     LE Pulses Right Left   FEMORAL 3+ palpable 3+ palpable    POPLITEAL not palpable  not palpable   POSTERIOR TIBIAL faintly palpable  faintly palpable    DORSALIS PEDIS  ANTERIOR TIBIAL 2+ palpable  2+ palpable      Gastrointestinal: soft, NTND, -G/R, - HSM, - masses palpated, - CVAT B.  Musculoskeletal: M/S 5/5 throughout except for left lower extremity which is 4/5, Extremities without ischemic changes. Left forearm with scars and deformities from old crush injury.  Neurologic: CN 2-12 grossly intact, Pain and light touch in extremities are intact, Motor exam as listed above.    DATA  AAA Duplex (10/30/2016):  Previous size: 4.3 cm (Date: 04-20-2016), Right CIA: 2.4 cm; Left CIA: 2.3 cm, based on limited visualization due to overlying bowel gas.    Current size:  4.3 cm (Date: 10-30-16); Right CIA: 2.3 cm; Left CIA: 2.3 cm.   Decreased visualization of the proximal aortic vasculature due to overlying bowel gas.  No significant stenosis of the abdominal aorta and bilateral proximal common iliac arteries.   No significant change compared to the exam on 04-20-16.   MRI Lumbar Spine requested by Dr. Venetia MaxonStern (05-05-15): AAA: 4.4 cm; Right CIA: 2.3 cm; Left CIA: 2.0 cm   Medical Decision Making  The patient is a 61 y.o. male who presents with asymptomatic AAA and bilateral CIA aneurysms, with no increase in size, based on limited visualization.  He states he took a liquid form of anti-gas medication last night, and avoided gas forming foods yesterday.    Based on this patient's exam and diagnostic studies, the patient will follow up in 6 months  with the following studies: AAA duplex.  Consideration for repair of AAA would be made when the size is 5.0 cm, growth > 1 cm/yr, and  symptomatic status.  Consideration for repair of common iliac artery aneurysm would be made when the diameter is 3.0-3.5 cm.         The patient was given information about AAA including signs, symptoms, treatment,  and how to minimize the risk of enlargement and rupture of aneurysms.    I emphasized the importance of maximal medical management including strict control of blood pressure, blood glucose, and lipid levels, antiplatelet agents, obtaining regular exercise, and continued cessation of smoking.   The patient was advised to call 911 should the patient experience sudden onset abdominal or back pain.   Thank you for allowing us to participate in this patient's care.  Charisse MarchSuzanne Nickel, RN, MSN, FNP-C [facility] [place] Office: [phone]  Clinic Physician: Myra GianottiBrabham  10/30/2016,  9:11 AM

## 2016-10-30 NOTE — Progress Notes (Signed)
Vitals:   10/30/16 0852  BP: (!) 152/97  Pulse: 68  Resp: 16  Temp: (!) 97.1 F (36.2 C)  Weight: 182 lb (82.6 kg)  Height: 6' (1.829 m)

## 2016-10-30 NOTE — Patient Instructions (Addendum)
Before your next abdominal ultrasound:  Take two Extra-Strength Gas-X capsules or liquid anti-gas medication at bedtime the night before the test. Take another two Extra-Strength Gas-X capsules  or liquid anti-gas medication 3 hours before the test.  Avoid gas forming foods the day before the test.       Abdominal Aortic Aneurysm Blood pumps away from the heart through tubes (blood vessels) called arteries. Aneurysms are weak or damaged places in the wall of an artery. It bulges out like a balloon. An abdominal aortic aneurysm happens in the main artery of the body (aorta). It can burst or tear, causing bleeding inside the body. This is an emergency. It needs treatment right away. What are the causes? The exact cause is unknown. Things that could cause this problem include:  Fat and other substances building up in the lining of a tube.  Swelling of the walls of a blood vessel.  Certain tissue diseases.  Belly (abdominal) trauma.  An infection in the main artery of the body.  What increases the risk? There are things that make it more likely for you to have an aneurysm. These include:  Being over the age of 59 years old.  Having high blood pressure (hypertension).  Being a male.  Being white.  Being very overweight (obese).  Having a family history of aneurysm.  Using tobacco products.  What are the signs or symptoms? Symptoms depend on the size of the aneurysm and how fast it grows. There may not be symptoms. If symptoms occur, they can include:  Pain (belly, side, lower back, or groin).  Feeling full after eating a small amount of food.  Feeling sick to your stomach (nauseous), throwing up (vomiting), or both.  Feeling a lump in your belly that feels like it is beating (pulsating).  Feeling like you will pass out (faint).  How is this treated?  Medicine to control blood pressure and pain.  Imaging tests to see if the aneurysm gets bigger.  Surgery. How is  this prevented? To lessen your chance of getting this condition:  Stop smoking. Stop chewing tobacco.  Limit or avoid alcohol.  Keep your blood pressure, blood sugar, and cholesterol within normal limits.  Eat less salt.  Eat foods low in saturated fats and cholesterol. These are found in animal and whole dairy products.  Eat more fiber. Fiber is found in whole grains, vegetables, and fruits.  Keep a healthy weight.  Stay active and exercise often.  This information is not intended to replace advice given to you by your health care provider. Make sure you discuss any questions you have with your health care provider. Document Released: 06/03/2012 Document Revised: 07/15/2015 Document Reviewed: 03/08/2012 Elsevier Interactive Patient Education  2017 [employer]

## 2016-11-16 NOTE — Addendum Note (Signed)
Addended by: Burton Apley A on: 11/16/2016 01:57 PM   Modules accepted: Orders

## 2017-04-16 DIAGNOSIS — K219 Gastro-esophageal reflux disease without esophagitis: Secondary | ICD-10-CM | POA: Insufficient documentation

## 2017-04-16 DIAGNOSIS — G4733 Obstructive sleep apnea (adult) (pediatric): Secondary | ICD-10-CM | POA: Insufficient documentation

## 2017-04-16 DIAGNOSIS — K4090 Unilateral inguinal hernia, without obstruction or gangrene, not specified as recurrent: Secondary | ICD-10-CM | POA: Insufficient documentation

## 2017-04-16 DIAGNOSIS — R490 Dysphonia: Secondary | ICD-10-CM | POA: Insufficient documentation

## 2017-04-30 ENCOUNTER — Other Ambulatory Visit: Payer: Self-pay

## 2017-04-30 ENCOUNTER — Ambulatory Visit (INDEPENDENT_AMBULATORY_CARE_PROVIDER_SITE_OTHER): Payer: Medicare Other | Admitting: Family

## 2017-04-30 ENCOUNTER — Ambulatory Visit (HOSPITAL_COMMUNITY)
Admission: RE | Admit: 2017-04-30 | Discharge: 2017-04-30 | Disposition: A | Discharge status: Home / Self Care | Payer: Medicare Other | Source: Ambulatory Visit | Attending: Family | Admitting: Family

## 2017-04-30 ENCOUNTER — Encounter: Payer: Self-pay | Admitting: Family

## 2017-04-30 VITALS — BP 127/90 | HR 69 | Temp 97.5°F | Resp 16 | Ht 72.0 in | Wt 188.0 lb

## 2017-04-30 DIAGNOSIS — I723 Aneurysm of iliac artery: Secondary | ICD-10-CM

## 2017-04-30 DIAGNOSIS — Z87891 Personal history of nicotine dependence: Secondary | ICD-10-CM

## 2017-04-30 DIAGNOSIS — R0989 Other specified symptoms and signs involving the circulatory and respiratory systems: Secondary | ICD-10-CM

## 2017-04-30 DIAGNOSIS — I714 Abdominal aortic aneurysm, without rupture, unspecified: Secondary | ICD-10-CM

## 2017-04-30 DIAGNOSIS — I77811 Abdominal aortic ectasia: Secondary | ICD-10-CM | POA: Insufficient documentation

## 2017-04-30 NOTE — Progress Notes (Signed)
[facility] [place]   CC: Follow up Abdominal Aortic Aneurysm  History of Present Illness  Lawrence Sandoval is a 62 y.o. (02-08-1956) male who who presents with chief complaint: follow up for AAA.  He was initially seen in December 2014 by Dr. Myra Gianotti for evaluation of an abdominal aortic aneurysm. This was detected on ultrasound during a workup for back pain. Maximum diameter was 3.9 cm.  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 He states his left toes remain numb.  He was treated in [place] for his back pain, but not not here; he lives in [place] for the last few years.  He was evaluated by Dr. Venetia Maxon in March 2017 for his lumbar disc disease, pt indicates that surgery was not recommended.   He has occasional chest and diaphragmatic area pain that is aggravated by lying down, relieved by sitting up; otherwise no abdominal pain referable to AAA. He states that he has had a cardiac work up and this was negative.  The patient suffers from hypercholesterolemia and is managed with a statin. He has hypertension which has not been well controlled. He has a history of smoking but quit in 2006. He also has hyperthyroidism that is managed medically, he takes methimazole.   The patient denies claudication in legs with walking, does have left leg radiculopathy with an HNP.  The patient denies history of stroke or TIA symptoms.   Pt Diabetic: No Pt smoker: former smoker, quit about 2006 and stopped ETOH use at that time.   Past Medical History:  Diagnosis Date  . AAA (abdominal aortic aneurysm) (HCC)   . Depression   . Hyperlipidemia   . Hypertension   . Hyperthyroidism   . Lumbago    Past Surgical History:  Procedure Laterality Date  . HAND SURGERY Left 1995   Hand and Arm injury.    Social History Social History   Socioeconomic History  . Marital status: Unknown    Spouse name: Not on file  . Number of children: Not on  file  . Years of education: Not on file  . Highest education level: Not on file  Social Needs  . Financial resource strain: Not on file  . Food insecurity - worry: Not on file  . Food insecurity - inability: Not on file  . Transportation needs - medical: Not on file  . Transportation needs - non-medical: Not on file  Occupational History  . Not on file  Tobacco Use  . Smoking status: Former Smoker    Years: 35.00    Types: Cigarettes    Last attempt to quit: 02/18/2003    Years since quitting: 14.2  . Smokeless tobacco: Never Used  Substance and Sexual Activity  . Alcohol use: No    Comment: quit abusing alcohol about 10 years ago  . Drug use: No  . Sexual activity: Not on file  Other Topics Concern  . Not on file  Social History Narrative  . Not on file   Family History Family History  Problem Relation Age of Onset  . Heart disease Mother        CHF  . Alcohol abuse Father   . Hypertension Father   . Stroke Maternal Grandfather     Current Outpatient Medications on File Prior to Visit  Medication Sig Dispense Refill  . diltiazem (CARDIZEM CD) 180 MG 24 hr capsule     . omeprazole (PRILOSEC) 20 MG capsule Take by mouth.    Marland Kitchen  pravastatin (PRAVACHOL) 20 MG tablet Take 20 mg by mouth daily.    Marland Kitchen. atenolol (TENORMIN) 50 MG tablet Take 50 mg by mouth daily.    . cetirizine (ZYRTEC) 10 MG tablet Take 10 mg by mouth daily. Reported on 09/06/2015    . meloxicam (MOBIC) 7.5 MG tablet Take 7.5 mg by mouth daily. Reported on 09/06/2015    . methimazole (TAPAZOLE) 5 MG tablet Take 5 mg by mouth daily.     . sertraline (ZOLOFT) 50 MG tablet Take 50 mg by mouth daily. Reported on 09/06/2015     No current facility-administered medications on file prior to visit.    No Known Allergies  ROS: See HPI for pertinent positives and negatives.  Physical Examination  Vitals:   04/30/17 0849  BP: 127/90  Pulse: 69  Resp: 16  Temp: (!) 97.5 F (36.4 C)  TempSrc: Oral  SpO2: 98%   Weight: 188 lb (85.3 kg)  Height: 6' (1.829 m)   Body mass index is 25.5 kg/m.  General: A&O x 3, WD. HEENT: No gross abnormalities  Pulmonary: Sym exp, good air movement in all fields, respirations are non labored, CTAB Cardiac: RRR, Nl S1, S2, no detected murmur.   Carotid Bruits Right Left   Negative Negative   Abdominal aortic pulse is not palpable Radial pulses are 2+ palpable.   VASCULAR EXAM:     LE Pulses Right Left   FEMORAL 3+ palpable 3+ palpable    POPLITEAL 2+ palpable  3+palpable   POSTERIOR TIBIAL notpalpable  2+palpable    DORSALIS PEDIS  ANTERIOR TIBIAL 1+ palpable  not palpable     Gastrointestinal: soft, NTND, -G/R, - HSM, - masses palpated, - CVAT B. Musculoskeletal: M/S 5/5 throughout except for left lower extremity which is 4/5, Extremities without ischemic changes. Left forearm with scars and deformities from old crush injury. Neurologic: CN 2-12 grossly intact, Pain and light touch in extremities are intact, Motor exam as listed above Skin: No rashes, no ulcers, no cellulitis.   Psychiatric: Normal thought content, mood appropriate to clinical situation.    DATA  AAA Duplex (04/30/2017):  Previous size:  4.3 cm (Date: 10-30-16); Right CIA: 2.3 cm; Left CIA: 2.3 cm.   Decreased visualization of the proximal aortic vasculature due to overlying bowel gas.  No significant stenosis of the abdominal aorta and bilateral proximal common iliac arteries.     Current size:  4.6 cm, mid (Date: 04/30/17); Right CIA: 2.3 cm; Left CIA: 2.5 cm  Visualization of the proximal abdominal aorta and left CIA was limited due to air/bowel gas   MRI Lumbar Spine requested by Dr. Venetia MaxonStern (05-05-15): AAA: 4.4 cm; Right CIA: 2.3 cm; Left  CIA: 2.0 cm   Medical Decision Making  The patient is a 62 y.o. male who presents with asymptomatic AAA with slight increase in size to 4.6 cm from 4.3 cm in six months; stable right CIA size, slight increase in left CIA size.   Based on this patient's exam and diagnostic studies, the patient will follow up in 6 months with the following studies: AAA duplex, and bilateral popliteal artery duplex for prominent popliteal pulses. He has no claudication sx's with walking, he does have known left sciatica.   Consideration for repair of AAA would be made when the size is 5.0 cm, growth > 1 cm/yr, and symptomatic status.       Consideration for repair of common iliac artery would be made when the size is 3 cm or  greater.         The patient was given information about AAA including signs, symptoms, treatment, and how to minimize the risk of       enlargement and rupture of aneurysms.    I emphasized the importance of maximal medical management including strict control of blood pressure, blood glucose, and lipid levels, antiplatelet agents, obtaining regular exercise, and continued cessation of smoking.   The patient was advised to call 911 should the patient experience sudden onset abdominal or back pain.   Thank you for allowing [place] to participate in this patient's care.  Charisse March, RN, MSN, FNP-C [facility] [place] Office: [phone]  Clinic Physician: Myra Gianotti  04/30/2017, 8:57 AM

## 2017-04-30 NOTE — Patient Instructions (Addendum)
Before your next abdominal ultrasound:  Take two Extra-Strength Gas-X capsules at bedtime the night before the test. Take another two Extra-Strength Gas-X capsules 3 hours before the test.  Avoid gas forming foods the day before the test.       Abdominal Aortic Aneurysm Blood pumps away from the heart through tubes (blood vessels) called arteries. Aneurysms are weak or damaged places in the wall of an artery. It bulges out like a balloon. An abdominal aortic aneurysm happens in the main artery of the body (aorta). It can burst or tear, causing bleeding inside the body. This is an emergency. It needs treatment right away. What are the causes? The exact cause is unknown. Things that could cause this problem include:  Fat and other substances building up in the lining of a tube.  Swelling of the walls of a blood vessel.  Certain tissue diseases.  Belly (abdominal) trauma.  An infection in the main artery of the body.  What increases the risk? There are things that make it more likely for you to have an aneurysm. These include:  Being over the age of 60 years old.  Having high blood pressure (hypertension).  Being a male.  Being white.  Being very overweight (obese).  Having a family history of aneurysm.  Using tobacco products.  What are the signs or symptoms? Symptoms depend on the size of the aneurysm and how fast it grows. There may not be symptoms. If symptoms occur, they can include:  Pain (belly, side, lower back, or groin).  Feeling full after eating a small amount of food.  Feeling sick to your stomach (nauseous), throwing up (vomiting), or both.  Feeling a lump in your belly that feels like it is beating (pulsating).  Feeling like you will pass out (faint).  How is this treated?  Medicine to control blood pressure and pain.  Imaging tests to see if the aneurysm gets bigger.  Surgery. How is this prevented? To lessen your chance of getting this  condition:  Stop smoking. Stop chewing tobacco.  Limit or avoid alcohol.  Keep your blood pressure, blood sugar, and cholesterol within normal limits.  Eat less salt.  Eat foods low in saturated fats and cholesterol. These are found in animal and whole dairy products.  Eat more fiber. Fiber is found in whole grains, vegetables, and fruits.  Keep a healthy weight.  Stay active and exercise often.  This information is not intended to replace advice given to you by your health care provider. Make sure you discuss any questions you have with your health care provider. Document Released: 06/03/2012 Document Revised: 07/15/2015 Document Reviewed: 03/08/2012 Elsevier Interactive Patient Education  2017 Elsevier Inc.  

## 2017-05-09 ENCOUNTER — Other Ambulatory Visit: Payer: Self-pay

## 2017-05-09 DIAGNOSIS — I714 Abdominal aortic aneurysm, without rupture, unspecified: Secondary | ICD-10-CM

## 2017-05-09 DIAGNOSIS — I739 Peripheral vascular disease, unspecified: Secondary | ICD-10-CM

## 2017-05-21 DIAGNOSIS — Z87891 Personal history of nicotine dependence: Secondary | ICD-10-CM | POA: Insufficient documentation

## 2017-11-01 ENCOUNTER — Ambulatory Visit (HOSPITAL_COMMUNITY)
Admission: RE | Admit: 2017-11-01 | Discharge: 2017-11-01 | Disposition: A | Discharge status: Home / Self Care | Payer: Medicare Other | Source: Ambulatory Visit | Attending: Family | Admitting: Family

## 2017-11-01 ENCOUNTER — Ambulatory Visit (INDEPENDENT_AMBULATORY_CARE_PROVIDER_SITE_OTHER)
Admission: RE | Admit: 2017-11-01 | Discharge: 2017-11-01 | Disposition: A | Discharge status: Home / Self Care | Payer: Medicare Other | Source: Ambulatory Visit | Attending: Family | Admitting: Family

## 2017-11-01 ENCOUNTER — Other Ambulatory Visit: Payer: Self-pay

## 2017-11-01 ENCOUNTER — Ambulatory Visit (INDEPENDENT_AMBULATORY_CARE_PROVIDER_SITE_OTHER): Payer: Medicare Other | Admitting: Family

## 2017-11-01 ENCOUNTER — Encounter: Payer: Self-pay | Admitting: Family

## 2017-11-01 VITALS — BP 135/93 | HR 68 | Temp 97.1°F | Resp 18 | Ht 72.0 in | Wt 179.0 lb

## 2017-11-01 DIAGNOSIS — Z87891 Personal history of nicotine dependence: Secondary | ICD-10-CM | POA: Diagnosis not present

## 2017-11-01 DIAGNOSIS — I714 Abdominal aortic aneurysm, without rupture, unspecified: Secondary | ICD-10-CM

## 2017-11-01 DIAGNOSIS — I739 Peripheral vascular disease, unspecified: Secondary | ICD-10-CM

## 2017-11-01 DIAGNOSIS — I723 Aneurysm of iliac artery: Secondary | ICD-10-CM | POA: Diagnosis not present

## 2017-11-01 NOTE — Progress Notes (Signed)
[facility]   CC: Follow up Abdominal Aortic Aneurysm  History of Present Illness  Lawrence Sandoval is a 62 y.o. (1955-11-08) male who presents with chief complaint: follow up for AAA.  He was initially seen in December 2014 by Dr. Myra Gianotti for evaluation of an abdominal aortic aneurysm. This was detected on ultrasound during a workup for back pain. Maximum diameter was 3.9 cm.  He has a known L3 HNP that causes intermittent left radiculopathy.He states his left toes remain numb. He was treated in [place] for his back pain, but not not here; he lives in [place] for the last few years.  He was evaluated by Dr. Venetia Maxon in March 2017 for his lumbar disc disease, pt indicates that surgery was not recommended.   He has occasional chest and diaphragmatic area pain that is aggravated by lying down, relieved by sitting up; otherwise no abdominal pain referable to AAA. He states that he has had a cardiac work up and this was negative.  The patient suffers from hypercholesterolemia and is managed with a statin. He has hypertension which has not been well controlled. He has a history of smoking but quit in 2006. He also has hyperthyroidism that is managed medically, he takes methimazole.   The patient denies claudication in legs with walking, does have left leg radiculopathy with an HNP.  The patient denies history of stroke or TIA symptoms.   Pt Diabetic: No Pt smoker: former smoker, quit about 2006 and stopped ETOH use at that time.    Past Medical History:  Diagnosis Date  . AAA (abdominal aortic aneurysm) (HCC)   . Depression   . Hyperlipidemia   . Hypertension   . Hyperthyroidism   . Lumbago    Past Surgical History:  Procedure Laterality Date  . HAND SURGERY Left 1995   Hand and Arm injury.    Social History Social History   Socioeconomic History  . Marital status: Unknown    Spouse name: Not on file  . Number of children: Not on  file  . Years of education: Not on file  . Highest education level: Not on file  Occupational History  . Not on file  Social Needs  . Financial resource strain: Not on file  . Food insecurity:    Worry: Not on file    Inability: Not on file  . Transportation needs:    Medical: Not on file    Non-medical: Not on file  Tobacco Use  . Smoking status: Former Smoker    Years: 35.00    Types: Cigarettes    Last attempt to quit: 02/18/2003    Years since quitting: 14.7  . Smokeless tobacco: Never Used  Substance and Sexual Activity  . Alcohol use: No    Comment: quit abusing alcohol about 10 years ago  . Drug use: No  . Sexual activity: Not on file  Lifestyle  . Physical activity:    Days per week: Not on file    Minutes per session: Not on file  . Stress: Not on file  Relationships  . Social connections:    Talks on phone: Not on file    Gets together: Not on file    Attends religious service: Not on file    Active member of club or organization: Not on file    Attends meetings of clubs or organizations: Not on file    Relationship status: Not on file  . Intimate partner violence:  Fear of current or ex partner: Not on file    Emotionally abused: Not on file    Physically abused: Not on file    Forced sexual activity: Not on file  Other Topics Concern  . Not on file  Social History Narrative  . Not on file   Family History Family History  Problem Relation Age of Onset  . Heart disease Mother        CHF  . Alcohol abuse Father   . Hypertension Father   . Stroke Maternal Grandfather     Current Outpatient Medications on File Prior to Visit  Medication Sig Dispense Refill  . atenolol (TENORMIN) 50 MG tablet Take 50 mg by mouth daily.    . methimazole (TAPAZOLE) 5 MG tablet Take 5 mg by mouth daily.     Marland Kitchen omeprazole (PRILOSEC) 20 MG capsule Take by mouth.    . pravastatin (PRAVACHOL) 20 MG tablet Take 20 mg by mouth daily.    . cetirizine (ZYRTEC) 10 MG tablet  Take 10 mg by mouth daily. Reported on 09/06/2015    . diltiazem (CARDIZEM CD) 180 MG 24 hr capsule     . meloxicam (MOBIC) 7.5 MG tablet Take 7.5 mg by mouth daily. Reported on 09/06/2015    . sertraline (ZOLOFT) 50 MG tablet Take 50 mg by mouth daily. Reported on 09/06/2015     No current facility-administered medications on file prior to visit.    No Known Allergies  ROS: See HPI for pertinent positives and negatives.  Physical Examination  Vitals:   11/01/17 0847  BP: (!) 135/93  Pulse: 68  Resp: 18  Temp: (!) 97.1 F (36.2 C)  TempSrc: Oral  SpO2: 99%  Weight: 179 lb (81.2 kg)  Height: 6' (1.829 m)   Body mass index is 24.28 kg/m.  General: A&O x 3, WD. HEENT: No gross abnormalities  Pulmonary: Sym exp, good air movement in all fields, respirations are non labored, CTAB Cardiac: RRR, Nl S1, S2, no detected murmur.   Carotid Bruits Right Left   Negative Negative   Abdominal aortic pulse is not palpable Radial pulses are 2+ palpable.   VASCULAR EXAM:     LE Pulses Right Left   FEMORAL 3+ palpable 3+ palpable    POPLITEAL 2+ palpable  3+palpable   POSTERIOR TIBIAL notpalpable  2+palpable    DORSALIS PEDIS  ANTERIOR TIBIAL 1+ palpable  not palpable     Gastrointestinal: soft, NTND, -G/R, - HSM, - masses palpated, - CVAT B. Musculoskeletal: M/S 5/5 throughout except for left lower extremity which is 4/5, Extremities without ischemic changes. Left forearm with scars and deformities from old crush injury. Neurologic: CN 2-12 grossly intact, Pain and light touch in extremities are intact, Motor exam as listed above Skin: No rashes, no ulcers, no cellulitis.   Psychiatric: Normal thought content, mood appropriate to clinical situation.      DATA  AAA Duplex (11/01/2017):  Previous size: 4.6 cm (Date: 04-30-17); Right CIA: 2.3 cm; Left CIA: 2.5 cm  Visualization of the proximal abdominal aorta and left CIA was limited due to air/bowel gas    Current size:  4.5 cm (Date: 11-01-17); Right CIA: 2.1 cm; Left CIA: 2.2 cm   Popliteal Artery Duplex (11-01-17): No evidence of aneurysms of the bilateral popliteal arteries.   MRI Lumbar Spine requested by Dr. Venetia Maxon (05-05-15): AAA: 4.4 cm; Right CIA: 2.3 cm; Left CIA: 2.0 cm   Medical Decision Making  The patient is a 62  y.o. male who presents with asymptomatic AAA with no increase in size, at 4.5 cm; right CIA measures 2.1 cm, left measures 2.2 cm.    Based on this patient's exam and diagnostic studies, the patient will follow up in 6 months with the following studies: AAA duplex.  Prominent bilateral popliteal pulses: Bilateral popliteal artery duplex today: no aneurysms.   Consideration for repair of AAA would be made when the size is 5.0 cm, growth > 1 cm/yr, and symptomatic status. Consideration for repair of common iliac artery would be made when the size is 3 cm or greater.          The patient was given information about AAA including signs, symptoms, treatment, and how to minimize the risk of enlargement and rupture of aneurysms.    I emphasized the importance of maximal medical management including strict control of blood pressure, blood glucose, and lipid levels, antiplatelet agents, obtaining regular exercise, and continued cessation of smoking.   The patient was advised to call 911 should the patient experience sudden onset abdominal or back pain.   Thank you for allowing us to participate in this patient's care.  Charisse MarchSuzanne Chiyoko Torrico, RN, MSN, FNP-C [facility] [place] Office: [phone]  Clinic Physician: Darrick PennaFields  11/01/2017, 9:16 AM

## 2017-11-01 NOTE — Patient Instructions (Addendum)
Before your next abdominal ultrasound:  Avoid gas forming foods and beverages the day before the test.   Take two Extra-Strength Gas-X capsules at bedtime the night before the test. Take another two Extra-Strength Gas-X capsules in the middle of the night if you get up to the restroom, if not, first thing in the morning with water.  Do not chew gum.      Abdominal Aortic Aneurysm Blood pumps away from the heart through tubes (blood vessels) called arteries. Aneurysms are weak or damaged places in the wall of an artery. It bulges out like a balloon. An abdominal aortic aneurysm happens in the main artery of the body (aorta). It can burst or tear, causing bleeding inside the body. This is an emergency. It needs treatment right away. What are the causes? The exact cause is unknown. Things that could cause this problem include:  Fat and other substances building up in the lining of a tube.  Swelling of the walls of a blood vessel.  Certain tissue diseases.  Belly (abdominal) trauma.  An infection in the main artery of the body.  What increases the risk? There are things that make it more likely for you to have an aneurysm. These include:  Being over the age of 60 years old.  Having high blood pressure (hypertension).  Being a male.  Being white.  Being very overweight (obese).  Having a family history of aneurysm.  Using tobacco products.  What are the signs or symptoms? Symptoms depend on the size of the aneurysm and how fast it grows. There may not be symptoms. If symptoms occur, they can include:  Pain (belly, side, lower back, or groin).  Feeling full after eating a small amount of food.  Feeling sick to your stomach (nauseous), throwing up (vomiting), or both.  Feeling a lump in your belly that feels like it is beating (pulsating).  Feeling like you will pass out (faint).  How is this treated?  Medicine to control blood pressure and pain.  Imaging tests to  see if the aneurysm gets bigger.  Surgery. How is this prevented? To lessen your chance of getting this condition:  Stop smoking. Stop chewing tobacco.  Limit or avoid alcohol.  Keep your blood pressure, blood sugar, and cholesterol within normal limits.  Eat less salt.  Eat foods low in saturated fats and cholesterol. These are found in animal and whole dairy products.  Eat more fiber. Fiber is found in whole grains, vegetables, and fruits.  Keep a healthy weight.  Stay active and exercise often.  This information is not intended to replace advice given to you by your health care provider. Make sure you discuss any questions you have with your health care provider. Document Released: 06/03/2012 Document Revised: 07/15/2015 Document Reviewed: 03/08/2012 Elsevier Interactive Patient Education  2017 Elsevier Inc.  

## 2017-11-02 ENCOUNTER — Encounter: Payer: Self-pay | Admitting: Family

## 2018-04-05 DIAGNOSIS — R400 Somnolence: Secondary | ICD-10-CM | POA: Insufficient documentation

## 2018-04-05 DIAGNOSIS — R0683 Snoring: Secondary | ICD-10-CM | POA: Insufficient documentation

## 2018-04-05 DIAGNOSIS — J432 Centrilobular emphysema: Secondary | ICD-10-CM | POA: Insufficient documentation

## 2018-04-29 ENCOUNTER — Other Ambulatory Visit: Payer: Self-pay

## 2018-04-29 DIAGNOSIS — I714 Abdominal aortic aneurysm, without rupture, unspecified: Secondary | ICD-10-CM

## 2018-05-02 ENCOUNTER — Ambulatory Visit: Payer: Medicare Other | Admitting: Family

## 2018-05-02 ENCOUNTER — Other Ambulatory Visit (HOSPITAL_COMMUNITY): Payer: Medicare Other

## 2018-05-02 ENCOUNTER — Encounter: Payer: Self-pay | Admitting: Family

## 2018-05-30 ENCOUNTER — Telehealth (HOSPITAL_COMMUNITY): Payer: Self-pay | Admitting: Rehabilitation

## 2018-05-30 NOTE — Telephone Encounter (Signed)
The above patient or their representative was contacted and gave the following answers to these questions:         Do you have any of the following symptoms? No  Fever                    Cough                   Shortness of breath  Do  you have any of the following other symptoms? No   muscle pain         vomiting,        diarrhea        rash         weakness        red eye        abdominal pain         bruising          bruising or bleeding              joint pain           severe headache    Have you been in contact with someone who was or has been sick in the past 2 weeks? No  Yes                 Unsure                         Unable to assess   Does the person that you were in contact with have any of the following symptoms?   Cough         shortness of breath           muscle pain         vomiting,            diarrhea            rash            weakness           fever            red eye           abdominal pain           bruising  or  bleeding                joint pain                severe headache               Have you  or someone you have been in contact with traveled internationally in th last month? No        If yes, which countries?   Have you  or someone you have been in contact with traveled outside [place] in th last month? No         If yes, which state and city?   COMMENTS OR ACTION PLAN FOR THIS PATIENT:          

## 2018-06-03 ENCOUNTER — Ambulatory Visit (INDEPENDENT_AMBULATORY_CARE_PROVIDER_SITE_OTHER): Payer: Medicare Other | Admitting: Family

## 2018-06-03 ENCOUNTER — Encounter: Payer: Self-pay | Admitting: Family

## 2018-06-03 ENCOUNTER — Other Ambulatory Visit: Payer: Self-pay

## 2018-06-03 ENCOUNTER — Ambulatory Visit (HOSPITAL_COMMUNITY)
Admission: RE | Admit: 2018-06-03 | Discharge: 2018-06-03 | Disposition: A | Discharge status: Home / Self Care | Payer: Medicare Other | Source: Ambulatory Visit | Attending: Surgery | Admitting: Surgery

## 2018-06-03 VITALS — BP 153/108 | HR 73 | Temp 97.8°F | Resp 20 | Ht 72.0 in | Wt 196.0 lb

## 2018-06-03 DIAGNOSIS — Z87891 Personal history of nicotine dependence: Secondary | ICD-10-CM | POA: Diagnosis not present

## 2018-06-03 DIAGNOSIS — I714 Abdominal aortic aneurysm, without rupture, unspecified: Secondary | ICD-10-CM

## 2018-06-03 DIAGNOSIS — G4710 Hypersomnia, unspecified: Secondary | ICD-10-CM | POA: Diagnosis not present

## 2018-06-03 NOTE — Patient Instructions (Addendum)
Abdominal Aortic Aneurysm    An aneurysm is a bulge in one of the blood vessels that carry blood away from the heart (artery). It happens when blood pushes up against a weak or damaged place in the wall of an artery. An abdominal aortic aneurysm happens in the main artery of the body (aorta).  Some aneurysms may not cause problems. If it grows, it can burst or tear, causing bleeding inside the body. This is an emergency. It needs to be treated right away.  What are the causes?  The exact cause of this condition is not known.  What increases the risk?  The following may make you more likely to get this condition:   Being a male who is 60 years of age or older.   Being white (Caucasian).   Using tobacco.   Having a family history of aneurysms.   Having the following conditions:  ? Hardening of the arteries (arteriosclerosis).  ? Inflammation of the walls of an artery (arteritis).  ? Certain genetic conditions.  ? Being very overweight (obesity).  ? An infection in the wall of the aorta (infectious aortitis).  ? High cholesterol.  ? High blood pressure (hypertension).  What are the signs or symptoms?  Symptoms depend on the size of the aneurysm and how fast it is growing. Most grow slowly and do not cause any symptoms. If symptoms do occur, they may include:   Pain in the belly (abdomen), side, or back.   Feeling full after eating only small amounts of food.   Feeling a throbbing lump in the belly.  Symptoms that the aneurysm has burst (ruptured) include:   Sudden, very bad pain in the belly, side, or back.   Feeling sick to your stomach (nauseous).   Throwing up (vomiting).   Feeling light-headed or passing out.  How is this treated?  Treatment for this condition depends on:   The size of the aneurysm.   How fast it is growing.   Your age.   Your risk of having it burst.  If your aneurysm is smaller than 2 inches (5 cm), your doctor may manage it by:   Checking it often to see if it is getting bigger.  You may have an imaging test (ultrasound) to check it every 3-6 months, every year, or every few years.   Giving you medicines to:  ? Control blood pressure.  ? Treat pain.  ? Fight infection.  If your aneurysm is larger than 2 inches (5 cm), you may need surgery to fix it.  Follow these instructions at home:  Lifestyle   Do not use any products that have nicotine or tobacco in them. This includes cigarettes, e-cigarettes, and chewing tobacco. If you need help quitting, ask your doctor.   Get regular exercise. Ask your doctor what types of exercise are best for you.  Eating and drinking   Eat a heart-healthy diet. This includes eating plenty of:  ? Fresh fruits and vegetables.  ? Whole grains.  ? Low-fat (lean) protein.  ? Low-fat dairy products.   Avoid foods that are high in saturated fat and cholesterol. These foods include red meat and some dairy products.   Do not drink alcohol if:  ? Your doctor tells you not to drink.  ? You are pregnant, may be pregnant, or are planning to become pregnant.   If you drink alcohol:  ? Limit how much you use to:   0-1 drink a day for women.     0-2 drinks a day for men.  ? Be aware of how much alcohol is in your drink. In the [place] one drink equals any of these:   One typical bottle of beer (12 oz).   One-half glass of wine (5 oz).   One shot of hard liquor (1 oz).  General instructions   Take over-the-counter and prescription medicines only as told by your doctor.   Keep your blood pressure within normal limits. Ask your doctor what your blood pressure should be.   Have your blood sugar (glucose) level and cholesterol levels checked regularly. Keep your blood sugar level and cholesterol levels within normal limits.   Avoid heavy lifting and activities that take a lot of effort. Ask your doctor what activities are safe for you.   Keep all follow-up visits as told by your doctor. This is important.  ? Talk to your doctor about regular screenings to see if the  aneurysm is getting bigger.  Contact a doctor if you:   Have pain in your belly, side, or back.   Have a throbbing feeling in your belly.   Have a family history of aneurysms.  Get help right away if you:   Have sudden, bad pain in your belly, side, or back.   Feel sick to your stomach.   Throw up.   Have trouble pooping (constipation).   Have trouble peeing (urinating).   Feel light-headed.   Have a fast heart rate when you stand.   Have sweaty skin that is cold to the touch (clammy).   Have shortness of breath.   Have a fever.  These symptoms may be an emergency. Do not wait to see if the symptoms will go away. Get medical help right away. Call your local emergency services (911 in the [place] Do not drive yourself to the hospital.  Summary   An aneurysm is a bulge in one of the blood vessels that carry blood away from the heart (artery). Some aneurysms may not cause problems.   You may need to have yours checked often. If it grows, it can burst or tear. This causes bleeding inside the body. It needs to be treated right away.   Follow instructions from your doctor about healthy lifestyle changes.   Keep all follow-up visits as told by your doctor. This is important.  This information is not intended to replace advice given to you by your health care provider. Make sure you discuss any questions you have with your health care provider.  Document Released: 06/03/2012 Document Revised: 09/15/2017 Document Reviewed: 09/15/2017  Elsevier Interactive Patient Education  2019 [employer] Inc[employer]

## 2018-06-03 NOTE — Progress Notes (Signed)
[facility] [place]   CC: Follow up Abdominal Aortic Aneurysm  History of Present Illness  Lawrence Sandoval is a 63 y.o. (07-12-1955) male who presents with chief complaint: follow up for AAA.  He was initially seen in December 2014 by Dr. Myra Gianotti for evaluation of an abdominal aortic aneurysm. This was detected on ultrasound during a workup for back pain. Maximum diameter was 3.9 cm.  He has a known L3 HNP that causes intermittent left radiculopathy.He states his left toes remain numb. He was treated in [place] for his back pain, but not not here; he lives in [place] for the last few years.  He was evaluated by Dr. Venetia Maxon in March 2017 for his lumbar disc disease, pt indicates that surgery was not recommended.   He has occasional chest and diaphragmatic area pain that is aggravated by lying down, relieved by sitting up; otherwise no abdominal pain referable to AAA. He states that he has had a cardiac work up and this was negative.  The patient suffers from hypercholesterolemia and is managed with a statin. He has hypertension which has not been well controlled. He has a history of smoking but quit in 2006. He also has hyperthyroidism that is managed medically, he takes methimazole.   He denies claudication type symptoms with walking, does have left leg radiculopathywith a HNP.  The patient denies history of stroke or TIA symptoms.  Diabetic: No Tobacco use: former smoker, quit about 2006 and stopped ETOH use at that time.     Past Medical History:  Diagnosis Date  . AAA (abdominal aortic aneurysm) (HCC)   . Depression   . Hyperlipidemia   . Hypertension   . Hyperthyroidism   . Lumbago    Past Surgical History:  Procedure Laterality Date  . HAND SURGERY Left 1995   Hand and Arm injury.    Social History Social History   Socioeconomic History  . Marital status: Unknown    Spouse name: Not on file  . Number of children: Not on file  .  Years of education: Not on file  . Highest education level: Not on file  Occupational History  . Not on file  Social Needs  . Financial resource strain: Not on file  . Food insecurity:    Worry: Not on file    Inability: Not on file  . Transportation needs:    Medical: Not on file    Non-medical: Not on file  Tobacco Use  . Smoking status: Former Smoker    Years: 35.00    Types: Cigarettes    Last attempt to quit: 02/18/2003    Years since quitting: 15.2  . Smokeless tobacco: Never Used  Substance and Sexual Activity  . Alcohol use: No    Comment: quit abusing alcohol about 10 years ago  . Drug use: No  . Sexual activity: Not on file  Lifestyle  . Physical activity:    Days per week: Not on file    Minutes per session: Not on file  . Stress: Not on file  Relationships  . Social connections:    Talks on phone: Not on file    Gets together: Not on file    Attends religious service: Not on file    Active member of club or organization: Not on file    Attends meetings of clubs or organizations: Not on file    Relationship status: Not on file  . Intimate partner violence:    Fear of  current or ex partner: Not on file    Emotionally abused: Not on file    Physically abused: Not on file    Forced sexual activity: Not on file  Other Topics Concern  . Not on file  Social History Narrative  . Not on file   Family History Family History  Problem Relation Age of Onset  . Heart disease Mother        CHF  . Alcohol abuse Father   . Hypertension Father   . Stroke Maternal Grandfather     Current Outpatient Medications on File Prior to Visit  Medication Sig Dispense Refill  . atenolol (TENORMIN) 50 MG tablet Take 50 mg by mouth daily.    . methimazole (TAPAZOLE) 5 MG tablet Take 5 mg by mouth daily.     Marland Kitchen omeprazole (PRILOSEC) 20 MG capsule Take by mouth.    . pravastatin (PRAVACHOL) 20 MG tablet Take 20 mg by mouth daily.     No current facility-administered  medications on file prior to visit.    No Known Allergies  ROS: See HPI for pertinent positives and negatives.  Physical Examination  Vitals:   06/03/18 0854  BP: (!) 153/108  Pulse: 73  Resp: 20  Temp: 97.8 F (36.6 C)  TempSrc: Oral  SpO2: 99%  Weight: 196 lb (88.9 kg)  Height: 6' (1.829 m)   Body mass index is 26.58 kg/m.  General: A&O x 3, WD, fit appearing male. HEENT: Grossly intact and WNL.  Pulmonary: Sym exp, respirations are non labored, good air movement in all fields, CTAB, no rales, rhonchi, or wheezing. Cardiac: Regular rhythm and rate, no detected murmur.  Carotid Bruits Right Left   Negative Negative   Adominal aortic pulse is not palpable Radial pulses are palpable                          VASCULAR EXAM:                                                                                                         LE Pulses Right Left       FEMORAL  2+ palpable  2+ palpable        POPLITEAL  not palpable   not palpable       POSTERIOR TIBIAL   palpable    palpable        DORSALIS PEDIS      ANTERIOR TIBIAL not palpable  not palpable     Gastrointestinal: soft, NTND, -G/R, - HSM, - masses palpated, - CVAT B. Musculoskeletal: M/S 5/5 throughout, Extremities without ischemic changes. Skin: No rashes, no ulcers, no cellulitis.  Left forearm with scars and mild skin deformities from old crush injury. Neurologic: CN 2-12 intact, Pain and light touch intact in extremities are intact, Motor exam as listed above. Psychiatric: Normal thought content, mood appropriate to clinical situation.    DATA  AAA Duplex  Previous size: 4.5 cm (Date: 11-01-17); Right CIA: 2.1 cm; Left CIA: 2.2 cm  Current size:  (Date: 06-03-18): Abdominal Aorta Findings: +-------------+-------+----------+----------+--------+--------+--------+ Location     AP (cm)Trans (cm)PSV  (cm/s)WaveformThrombusComments +-------------+-------+----------+----------+--------+--------+--------+ Proximal     2.08   2.10      70                                 +-------------+-------+----------+----------+--------+--------+--------+ Mid          4.61   4.30      76                                 +-------------+-------+----------+----------+--------+--------+--------+ Distal       4.68   4.90      39                                 +-------------+-------+----------+----------+--------+--------+--------+ RT CIA Prox  2.2    2.3       108                                +-------------+-------+----------+----------+--------+--------+--------+ LT CIA Prox  1.5    1.5       50                                 +-------------+-------+----------+----------+--------+--------+--------+ LT CIA Mid   1.6    1.7                                          +-------------+-------+----------+----------+--------+--------+--------+ LT CIA Distal1.3    1.2                                          +-------------+-------+----------+----------+--------+--------+--------+  Summary: Abdominal Aorta: There is evidence of abnormal dilitation of the Mid and Distal Abdominal aorta. There is evidence of abnormal dilation of the Right Common Iliac artery. The largest aortic measurement is 4.9 cm. Bilobed AAA in the mid and distal  abdominal aorta. Could not replicate diameters in previously recorded in the left common iliac artery. The largest aortic diameter has increased compared to prior exam. Previous diameter measurement was 4.5 cm obtained on 11/01/2017.    Popliteal Artery Duplex (11-01-17): No evidence of aneurysms of the bilateral popliteal arteries.   MRI Lumbar Spine requested by Dr. Venetia MaxonStern (05-05-15): AAA: 4.4 cm; Right CIA: 2.3 cm; Left CIA: 2.0 cm    Medical Decision Making  The patient is a 63 y.o. male who presents with asymptomatic AAA  with increase in size to 4.9 cm today from 4.5 cm in September 2019.   Prominent bilateral popliteal pulses at a previous visit: Bilateral popliteal artery duplex on 11-01-17: no aneurysms.    His blood pressure is elevated at 153/108; he states he has a headache, but denies chest pain or dyspnea. I advised him to call his PCP's office today and ask their advice; he states he took his antihypertensive medication today, on an empty stomach, states this may be why he has a headache.    Based on this patient's exam  and diagnostic studies, and after discussing with Dr. Myra Gianotti, the patient will be scheduled for CT angiogram of abd/pelvis, in 3 months, see Dr. Myra Gianotti afterward.   Consideration for repair of AAA would be made when the size is 5 -5.5 cm, growth > 1 cm/yr, and symptomatic status.  Abdominal aortic aneurysm less than 5-1/2 cm in diameter has less then 1/2% risk of rupture per year.        The patient was given information about AAA including signs, symptoms, treatment, and how to minimize the risk of enlargement and rupture of aneurysms.    I emphasized the importance of maximal medical management including strict control of blood pressure, blood glucose, and lipid levels, antiplatelet agents, obtaining regular exercise, and continued cessation of smoking.   The patient was advised to call 911 should the patient experience sudden onset abdominal or back pain.   Thank you for allowing [place] to participate in this patient's care.  Charisse March, RN, MSN, FNP-C [facility] [place] Office: [phone]  Clinic Physician: Myra Gianotti  06/03/2018, 9:01 AM

## 2018-08-01 ENCOUNTER — Other Ambulatory Visit: Payer: Self-pay | Admitting: Surgery

## 2018-08-01 DIAGNOSIS — I723 Aneurysm of iliac artery: Secondary | ICD-10-CM

## 2018-08-01 DIAGNOSIS — I714 Abdominal aortic aneurysm, without rupture, unspecified: Secondary | ICD-10-CM

## 2018-08-27 ENCOUNTER — Ambulatory Visit
Admission: RE | Admit: 2018-08-27 | Discharge: 2018-08-27 | Disposition: A | Discharge status: Home / Self Care | Payer: Medicare Other | Source: Ambulatory Visit | Attending: Surgery | Admitting: Surgery

## 2018-08-27 DIAGNOSIS — I714 Abdominal aortic aneurysm, without rupture, unspecified: Secondary | ICD-10-CM

## 2018-08-27 DIAGNOSIS — I723 Aneurysm of iliac artery: Secondary | ICD-10-CM

## 2018-08-27 MED ORDER — IOPAMIDOL (ISOVUE-370) INJECTION 76%
75.0000 mL | Freq: Once | INTRAVENOUS | Status: AC | PRN
  Administered 2018-08-27: 75 mL via INTRAVENOUS

## 2018-09-02 ENCOUNTER — Encounter: Payer: Self-pay | Admitting: Surgery

## 2018-09-02 ENCOUNTER — Ambulatory Visit (INDEPENDENT_AMBULATORY_CARE_PROVIDER_SITE_OTHER): Payer: Medicare Other | Admitting: Surgery

## 2018-09-02 ENCOUNTER — Other Ambulatory Visit: Payer: Self-pay

## 2018-09-02 DIAGNOSIS — I714 Abdominal aortic aneurysm, without rupture, unspecified: Secondary | ICD-10-CM

## 2018-09-02 NOTE — Progress Notes (Signed)
[facility] [place]  Patient name: Lawrence Sandoval MRN: [id number] DOB: 1955/06/28 Sex: male      Virtual Visit via Video Note   This visit type was conducted due to national recommendations for restrictions regarding the COVID-19 Pandemic (e.g. social distancing) in an effort to limit this patient's exposure and mitigate transmission in our community.  Due to his co-morbid illnesses, this patient is at least at moderate risk for complications without adequate follow up.  This format is felt to be most appropriate for this patient at this time.  All issues noted in this document were discussed and addressed.  A limited physical exam was performed with this format.  Please refer to the patient's chart for his consent to telehealth for [facility].  Patient Location: Home Provider Location: Office    REASON FOR APPOINTMENT:    Follow up  HISTORY OF PRESENT ILLNESS:   Lawrence Sandoval is a 63 y.o. male, who has been followed for an abdominal aortic aneurysm.  His most recent ultrasound suggested an increase in size to 4.9 cm and so he was sent for CT scan.  He is back today to discuss these results.  He denies any abdominal pain or back pain.  Patient suffers from hypercholesterolemia which is managed with a statin.  He is medically managed for hypertension.  He is a former smoker.   The patient does not have symptoms concerning for COVID-19 infection (fever, chills, cough, or new shortness of breath).   PAST MEDICAL HISTORY    Past Medical History:  Diagnosis Date  . AAA (abdominal aortic aneurysm) ([place])   . Depression   . Hyperlipidemia   . Hypertension   . Hyperthyroidism   . Lumbago      FAMILY HISTORY   Family History  Problem Relation Age of Onset  . Heart disease Mother        CHF  . Alcohol abuse Father   . Hypertension Father   . Stroke Maternal Grandfather     SOCIAL HISTORY:   Social History   Socioeconomic History  . Marital  status: Unknown    Spouse name: Not on file  . Number of children: Not on file  . Years of education: Not on file  . Highest education level: Not on file  Occupational History  . Not on file  Social Needs  . Financial resource strain: Not on file  . Food insecurity    Worry: Not on file    Inability: Not on file  . Transportation needs    Medical: Not on file    Non-medical: Not on file  Tobacco Use  . Smoking status: Former Smoker    Years: 35.00    Types: Cigarettes    Quit date: 02/18/2003    Years since quitting: 15.5  . Smokeless tobacco: Never Used  Substance and Sexual Activity  . Alcohol use: No    Comment: quit abusing alcohol about 10 years ago  . Drug use: No  . Sexual activity: Not on file  Lifestyle  . Physical activity    Days per week: Not on file    Minutes per session: Not on file  . Stress: Not on file  Relationships  . Social Herbalist on phone: Not on file    Gets together: Not on file    Attends religious service: Not on file    Active member of  club or organization: Not on file    Attends meetings of clubs or organizations: Not on file    Relationship status: Not on file  . Intimate partner violence    Fear of current or ex partner: Not on file    Emotionally abused: Not on file    Physically abused: Not on file    Forced sexual activity: Not on file  Other Topics Concern  . Not on file  Social History Narrative  . Not on file    ALLERGIES:    No Known Allergies  CURRENT MEDICATIONS:    Current Outpatient Medications  Medication Sig Dispense Refill  . atenolol (TENORMIN) 50 MG tablet Take 50 mg by mouth daily.    . methimazole (TAPAZOLE) 5 MG tablet Take 5 mg by mouth daily.     Marland Kitchen. omeprazole (PRILOSEC) 20 MG capsule Take by mouth.    . pravastatin (PRAVACHOL) 20 MG tablet Take 20 mg by mouth daily.     No current facility-administered medications for this visit.     REVIEW OF SYSTEMS:   Please see the history of  present illness.     All other systems reviewed and are negative.  PHYSICAL EXAM:   There were no vitals filed for this visit.  GENERAL: The patient is a well-nourished male, in no acute distress.  PULMONARY: Nonlabored respirations PSYCHIATRIC: The patient has a normal affect.   Recent Labs: No results found for requested labs within last 8760 hours.   Recent Lipid Panel No results found for: CHOL, TRIG, HDL, CHOLHDL, LDLCALC, LDLDIRECT  Wt Readings from Last 3 Encounters:  06/03/18 196 lb (88.9 kg)  11/01/17 179 lb (81.2 kg)  04/30/17 188 lb (85.3 kg)     STUDIES:   I have reviewed his CTA with the following findings: 1. Bilobed fusiform infrarenal abdominal aortic aneurysm measuring up to 4.8 cm. Aortic aneurysm NOS (ICD10-I71.9); Aortic Atherosclerosis (ICD10-170.0) 2. 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 3. Aneurysmal left common iliac artery measuring up to 2.5 cm. 4. 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  NON-VASCULAR  1. 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 However, the lesions are incompletely evaluated on the present study. In the absence of a known primary malignancy, these are statistically likely benign hemangiomas. If the patient is considered low risk, then attention on follow-up imaging related to the patient's abdominal aorta should be considered to confirm stability. Alternately, if the patient has a history of prior malignancy or is otherwise high risk, MRI of the liver with gadolinium contrast could be considered for full characterization of these lesions. 2. Left lower quadrant inguinal hernia containing omental fat and the bladder dome. 3. Colonic diverticular disease without CT evidence of active inflammation.  ASSESSMENT and PLAN   AAA: Maximum aortic diameter is 4.8 cm.  He  also has ectatic short common iliacs and potentially may require embolization preoperatively.  I discussed that I would recommend waiting until his aneurysm is greater than 5 cm before recommending repair.  I will have him come back in 6 months with a repeat CT scan.  I will also get preoperative studies of his carotids and leg arteries.  The patient understands that if he develops severe back pain or abdominal pain that he needs to be seen emergently.   COVID-19 Education: The signs and symptoms of COVID-19 were discussed with the patient  and how to seek care for testing (follow up with PCP or arrange E-visit).  The importance of social distancing was discussed today.  Time:   Today, I have spent 18 minutes with the patient with telehealth technology discussing the above problems.     Charlena CrossWells , IV, MD, FACS [facility] Tel [phone] Pager [phone]

## 2019-02-27 ENCOUNTER — Other Ambulatory Visit: Payer: Self-pay

## 2019-02-27 DIAGNOSIS — I714 Abdominal aortic aneurysm, without rupture, unspecified: Secondary | ICD-10-CM

## 2019-03-13 ENCOUNTER — Other Ambulatory Visit: Payer: Medicare Other

## 2019-03-17 ENCOUNTER — Ambulatory Visit: Payer: Medicare Other | Admitting: Surgery

## 2019-03-27 ENCOUNTER — Other Ambulatory Visit: Payer: Self-pay | Admitting: Surgery

## 2019-03-27 ENCOUNTER — Other Ambulatory Visit: Payer: Self-pay

## 2019-03-27 ENCOUNTER — Ambulatory Visit
Admission: RE | Admit: 2019-03-27 | Discharge: 2019-03-27 | Disposition: A | Discharge status: Home / Self Care | Payer: Medicare Other | Source: Ambulatory Visit | Attending: Surgery | Admitting: Surgery

## 2019-03-27 DIAGNOSIS — I714 Abdominal aortic aneurysm, without rupture, unspecified: Secondary | ICD-10-CM

## 2019-03-27 DIAGNOSIS — Z87898 Personal history of other specified conditions: Secondary | ICD-10-CM

## 2019-03-31 ENCOUNTER — Ambulatory Visit: Payer: Medicare Other | Admitting: Surgery

## 2019-04-03 ENCOUNTER — Ambulatory Visit
Admission: RE | Admit: 2019-04-03 | Discharge: 2019-04-03 | Disposition: A | Discharge status: Home / Self Care | Payer: Medicare Other | Source: Ambulatory Visit | Attending: Surgery | Admitting: Surgery

## 2019-04-03 DIAGNOSIS — I714 Abdominal aortic aneurysm, without rupture, unspecified: Secondary | ICD-10-CM

## 2019-04-03 DIAGNOSIS — Z87898 Personal history of other specified conditions: Secondary | ICD-10-CM

## 2019-04-03 HISTORY — PX: IR US GUIDE VASC ACCESS RIGHT: IMG2390

## 2019-04-03 MED ORDER — IOPAMIDOL (ISOVUE-370) INJECTION 76%
75.0000 mL | Freq: Once | INTRAVENOUS | Status: AC | PRN
  Administered 2019-04-03: 75 mL via INTRAVENOUS

## 2019-04-04 ENCOUNTER — Other Ambulatory Visit: Payer: Medicare Other

## 2019-04-28 ENCOUNTER — Ambulatory Visit (INDEPENDENT_AMBULATORY_CARE_PROVIDER_SITE_OTHER): Payer: Medicare Other | Admitting: Surgery

## 2019-04-28 ENCOUNTER — Encounter: Payer: Self-pay | Admitting: Surgery

## 2019-04-28 ENCOUNTER — Other Ambulatory Visit: Payer: Self-pay

## 2019-04-28 DIAGNOSIS — I714 Abdominal aortic aneurysm, without rupture, unspecified: Secondary | ICD-10-CM

## 2019-04-28 NOTE — Progress Notes (Signed)
[facility] [place]  Patient name: Lawrence Sandoval MRN: [id number] DOB: 1955/10/28 Sex: male      Virtual Visit via Telephone Note   This visit type was conducted due to national recommendations for restrictions regarding the COVID-19 Pandemic (e.g. social distancing) in an effort to limit this patient's exposure and mitigate transmission in our community.  Due to his co-morbid illnesses, this patient is at least at moderate risk for complications without adequate follow up.  This format is felt to be most appropriate for this patient at this time.  The patient did not have access to video technology/had technical difficulties with video requiring transitioning to audio format only (telephone).  All issues noted in this document were discussed and addressed.  No physical exam could be performed with this format.    Patient Location: Home Provider Location: Office    REASON FOR APPOINTMENT:    Follow up  HISTORY OF PRESENT ILLNESS:   Lawrence Sandoval is a 64 y.o. male, who has been followed for an abdominal aortic aneurysm.  His most recent ultrasound suggested an increase in size to 4.9 cm and so he was sent for CT scan.  He is back today to discuss these results.  He denies any abdominal pain or back pain.  Patient suffers from hypercholesterolemia which is managed with a statin.  He is medically managed for hypertension.  He is a former smoker.   The patient does not have symptoms concerning for COVID-19 infection (fever, chills, cough, or new shortness of breath).   PAST MEDICAL HISTORY    Past Medical History:  Diagnosis Date  . AAA (abdominal aortic aneurysm) ([place])   . Depression   . Hyperlipidemia   . Hypertension   . Hyperthyroidism   . Lumbago      FAMILY HISTORY   Family History  Problem Relation Age of Onset  . Heart disease Mother        CHF  . Alcohol abuse Father   . Hypertension Father   . Stroke Maternal Grandfather       SOCIAL HISTORY:   Social History   Socioeconomic History  . Marital status: Unknown    Spouse name: Not on file  . Number of children: Not on file  . Years of education: Not on file  . Highest education level: Not on file  Occupational History  . Not on file  Tobacco Use  . Smoking status: Former Smoker    Years: 35.00    Types: Cigarettes    Quit date: 02/18/2003    Years since quitting: 16.2  . Smokeless tobacco: Never Used  Substance and Sexual Activity  . Alcohol use: No    Comment: quit abusing alcohol about 10 years ago  . Drug use: No  . Sexual activity: Not on file  Other Topics Concern  . Not on file  Social History Narrative  . Not on file   Social Determinants of Health   Financial Resource Strain:   . Difficulty of Paying Living Expenses: Not on file  Food Insecurity:   . Worried About Charity fundraiser in the Last Year: Not on file  . Ran Out of Food in the Last Year: Not on file  Transportation Needs:   . Lack of Transportation (Medical): Not on file  . Lack of Transportation (Non-Medical): Not on file  Physical Activity:   . Days of Exercise per Week:  Not on file  . Minutes of Exercise per Session: Not on file  Stress:   . Feeling of Stress : Not on file  Social Connections:   . Frequency of Communication with Friends and Family: Not on file  . Frequency of Social Gatherings with Friends and Family: Not on file  . Attends Religious Services: Not on file  . Active Member of Clubs or Organizations: Not on file  . Attends Banker Meetings: Not on file  . Marital Status: Not on file  Intimate Partner Violence:   . Fear of Current or Ex-Partner: Not on file  . Emotionally Abused: Not on file  . Physically Abused: Not on file  . Sexually Abused: Not on file    ALLERGIES:    No Known Allergies  CURRENT MEDICATIONS:    Current Outpatient Medications  Medication Sig Dispense Refill  . atenolol (TENORMIN) 50 MG tablet Take  50 mg by mouth daily.    . cetirizine (ZYRTEC) 10 MG tablet Take 10 mg by mouth daily.    Marland Kitchen diltiazem (CARDIZEM CD) 180 MG 24 hr capsule Take 180 mg by mouth daily.    . methimazole (TAPAZOLE) 5 MG tablet Take 5 mg by mouth daily.     Marland Kitchen omeprazole (PRILOSEC) 20 MG capsule Take by mouth.    . pravastatin (PRAVACHOL) 20 MG tablet Take 20 mg by mouth daily.     No current facility-administered medications for this visit.    REVIEW OF SYSTEMS:   Please see the history of present illness.     All other systems reviewed and are negative.  PHYSICAL EXAM:   There were no vitals filed for this visit.  GENERAL: The patient is in no acute distress.  PULMONARY: Nonlabored breathing ABDOMEN: Soft and non-tender with normal pitched bowel sounds.  NEUROLOGIC: No slurred speech PSYCHIATRIC: The patient has a normal affect.   Recent Labs: No results found for requested labs within last 8760 hours.   Recent Lipid Panel No results found for: CHOL, TRIG, HDL, CHOLHDL, LDLCALC, LDLDIRECT  Wt Readings from Last 3 Encounters:  06/03/18 196 lb (88.9 kg)  11/01/17 179 lb (81.2 kg)  04/30/17 188 lb (85.3 kg)     STUDIES:   I have reviewed his CTA with the following findings: IMPRESSION: 1. 5 cm bilobed infrarenal abdominal aortic aneurysm (previously 4.8) with extension into the right common iliac artery, 2.4 cm diameter. 2. 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 3. 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 4. Negative for acute PE or thoracic aortic dissection. 5. Coronary calcifications. The severity of coronary artery disease and any potential stenosis cannot be assessed on this non-gated CT examination.  6. Stable pulmonary nodules 6 mm or less. Continued surveillance recommended. 7. Stable small hypervascular liver lesions, nonspecific. 8. Descending and sigmoid diverticulosis. 9. Left inguinal hernia containing fat and a portion of the bladder wall. ASSESSMENT and  PLAN   AAA: We discussed that his aneurysm is now at 5 cm where I would recommend repair.  I would like to have this discussion with him face-to-face.  He is agreeable to this and so I will have him return to see me in 3-4 weeks.  Prior to his visit he is going to need carotid Doppler studies as well as ABIs and lower extremity duplex.  I am also referring him to cardiology for preoperative clearance.   Time:   Today, I have spent 12 minutes with the patient with telehealth  technology discussing the above problems.     Charlena Cross, MD, FACS [facility] [phone] Pager [phone]

## 2019-04-29 ENCOUNTER — Other Ambulatory Visit: Payer: Self-pay | Admitting: *Deleted

## 2019-04-29 DIAGNOSIS — Z01818 Encounter for other preprocedural examination: Secondary | ICD-10-CM

## 2019-04-29 DIAGNOSIS — I739 Peripheral vascular disease, unspecified: Secondary | ICD-10-CM

## 2019-04-29 DIAGNOSIS — I723 Aneurysm of iliac artery: Secondary | ICD-10-CM

## 2019-05-02 ENCOUNTER — Encounter: Payer: Self-pay | Admitting: Surgery

## 2019-05-05 ENCOUNTER — Ambulatory Visit: Payer: Medicare Other | Admitting: Surgery

## 2019-05-08 ENCOUNTER — Encounter: Payer: Self-pay | Admitting: Cardiology

## 2019-05-08 ENCOUNTER — Ambulatory Visit (INDEPENDENT_AMBULATORY_CARE_PROVIDER_SITE_OTHER): Payer: Medicare Other | Admitting: Cardiology

## 2019-05-08 ENCOUNTER — Other Ambulatory Visit: Payer: Self-pay

## 2019-05-08 VITALS — BP 131/92 | HR 78 | Temp 97.3°F | Ht 72.0 in | Wt 217.0 lb

## 2019-05-08 DIAGNOSIS — Z01810 Encounter for preprocedural cardiovascular examination: Secondary | ICD-10-CM | POA: Diagnosis not present

## 2019-05-08 DIAGNOSIS — E785 Hyperlipidemia, unspecified: Secondary | ICD-10-CM | POA: Diagnosis not present

## 2019-05-08 DIAGNOSIS — Z01818 Encounter for other preprocedural examination: Secondary | ICD-10-CM

## 2019-05-08 DIAGNOSIS — I714 Abdominal aortic aneurysm, without rupture, unspecified: Secondary | ICD-10-CM

## 2019-05-08 DIAGNOSIS — Z7189 Other specified counseling: Secondary | ICD-10-CM

## 2019-05-08 DIAGNOSIS — I10 Essential (primary) hypertension: Secondary | ICD-10-CM | POA: Diagnosis not present

## 2019-05-08 NOTE — Progress Notes (Signed)
Cardiology Office Note:    Date:  05/08/2019   ID:  Lawrence Sandoval, DOB 05-30-1955, MRN [id number]  PCP:  Lawrence Face, DO  Cardiologist:  Lawrence Red, MD  Referring MD: [facility]   CC: new patient consultation for preoperative cardiovascular evaluation  History of Present Illness:    Lawrence Sandoval is a 64 y.o. male with a hx of AAA, hypertension, hypercholesterolemia who is seen as a new consult at the request of Lawrence Sandoval* for the evaluation and management of preoperative cardiovascular risk.  Note from 04/28/19 with Dr. Myra Sandoval reviewed. He has upcoming appt to discuss surgery for his AAA. He is sent to cardiology for a preoperative evaluation.  Planned surgery: AAA, TBD whether open or graft, has upcoming appt with Dr. Myra Sandoval to discuss.   Pertinent past cardiac history: hypertension, hyperlipidemia, AAA Prior cardiac workup: none History of valve disease: none that he knows of History of CAD/CVA/TIA: none History of heart failure: none History of arrhythmia: none On anticoagulation: none Additional history: Denies diabetes, CKD, anesthesia complications. Does have OSA on CPAP, but feels this hasn't helped his sleep Current symptoms: Denies chest pain, shortness of breath at rest or with normal exertion. No PND, orthopnea, LE edema or unexpected weight gain. No syncope or palpitations. Functional capacity: cleans his apartment. Can climb stairs without issue, use to climb stairs routinely. Walks regularly. Doesn't sit still well. Quit smoking and drinking 10-12 years ago.  Blood pressure controlled on atenolol 50 mg daily, diltiazem 180 mg daily. Believes he has been these medications for a long time. Does not recall tachycardia/palpitations as reason for dual AV nodal agents. On pravastatin. Has been recommended to take baby aspirin but hasn't taken it yet.   Just had blood work taken at his new PCP last week. Saw Lawrence Sandoval at [facility].  Will ask for labs to be sent over.  Past Medical History:  Diagnosis Date  . AAA (abdominal aortic aneurysm) (HCC)   . Depression   . Hyperlipidemia   . Hypertension   . Hyperthyroidism   . Lumbago     Past Surgical History:  Procedure Laterality Date  . HAND SURGERY Left 1995   Hand and Arm injury.   . IR US GUIDE VASC ACCESS RIGHT  04/03/2019    Current Medications: Current Outpatient Medications on File Prior to Visit  Medication Sig  . atenolol (TENORMIN) 50 MG tablet Take 50 mg by mouth daily.  . cetirizine (ZYRTEC) 10 MG tablet Take 10 mg by mouth daily.  Marland Kitchen diltiazem (CARDIZEM CD) 180 MG 24 hr capsule Take 180 mg by mouth daily.  . methimazole (TAPAZOLE) 5 MG tablet Take 5 mg by mouth daily.   Marland Kitchen omeprazole (PRILOSEC) 20 MG capsule Take by mouth.  . pravastatin (PRAVACHOL) 20 MG tablet Take 20 mg by mouth daily.   No current facility-administered medications on file prior to visit.     Allergies:   Patient has no known allergies.   Social History   Tobacco Use  . Smoking status: Former Smoker    Years: 35.00    Types: Cigarettes    Quit date: 02/18/2003    Years since quitting: 16.2  . Smokeless tobacco: Never Used  Substance Use Topics  . Alcohol use: No    Comment: quit abusing alcohol about 10 years ago  . Drug use: No    Family History: family history includes Alcohol abuse in his father; Heart disease in his mother; Hypertension in his  father; Stroke in his maternal grandfather.  ROS:   Please see the history of present illness.  Additional pertinent ROS: Constitutional: Negative for chills, fever, night sweats, unintentional weight loss  HENT: Negative for ear pain and hearing loss.   Eyes: Negative for loss of vision and eye pain.  Respiratory: Negative for cough, sputum, wheezing.   Cardiovascular: See HPI. Gastrointestinal: Negative for abdominal pain, melena, and hematochezia.  Genitourinary: Negative for dysuria and hematuria.   Musculoskeletal: Negative for falls and myalgias.  Skin: Negative for itching and rash.  Neurological: Negative for focal weakness, focal sensory changes and loss of consciousness.  Endo/Heme/Allergies: Does not bruise/bleed easily.     EKGs/Labs/Other Studies Reviewed:    The following studies were reviewed today: Prior vascular studies reviewed No prior cardiac studies  EKG:  EKG is personally reviewed.  The ekg ordered today demonstrates NSR  Recent Labs: No results found for requested labs within last 8760 hours.  Recent Lipid Panel No results found for: CHOL, TRIG, HDL, CHOLHDL, VLDL, LDLCALC, LDLDIRECT  Physical Exam:    VS:  BP (!) 131/92 (BP Location: Right Arm)   Pulse 78   Temp (!) 97.3 F (36.3 C)   Ht 6' (1.829 m)   Wt 217 lb (98.4 kg)   SpO2 99%   BMI 29.43 kg/m     Wt Readings from Last 3 Encounters:  05/08/19 217 lb (98.4 kg)  06/03/18 196 lb (88.9 kg)  11/01/17 179 lb (81.2 kg)    GEN: Well nourished, well developed in no acute distress HEENT: Normal, moist mucous membranes NECK: No JVD CARDIAC: regular rhythm, normal S1 and S2, no rubs or gallops. No murmurs. VASCULAR: Radial and DP pulses 2+ bilaterally. No carotid bruits RESPIRATORY:  Clear to auscultation without rales, wheezing or rhonchi  ABDOMEN: Soft, non-tender, non-distended MUSCULOSKELETAL:  Ambulates independently SKIN: Warm and dry, no edema NEUROLOGIC:  Alert and oriented x 3. No focal neuro deficits noted. PSYCHIATRIC:  Normal affect    ASSESSMENT:    1. Pre-op evaluation   2. Essential hypertension   3. Hyperlipidemia, unspecified hyperlipidemia type   4. AAA (abdominal aortic aneurysm) without rupture (HCC)   5. Cardiac risk counseling   6. Counseling on health promotion and disease prevention    PLAN:   Preoperative cardiovascular risk: Based on available date, patient's RCRI score = 1 only for vascular surgery, which carries a 6% 30-day risk of death, MI, or cardiac  arrest.  The patient is not currently having active cardiac symptoms, and they can achieve >4 METs of activity.  According to ACC/AHA Guidelines, no further testing is needed.  Proceed with surgery at acceptable risk.  Our service is available as needed in the peri-operative period.   Hypertension: -goal <130/80. Systolic at goal today, diastolic mildly elevated. -continue atenolol. Can consider switch to carvedilol in the future if better blood pressure control needed -on diltiazem, no history that he knows of that requires dual AV nodal agents. Would consider changing this to amlodipine as first step if BP rises -also not on diuretic, would consider chlorthalidone if needed in the future  Hyperlipidemia: -will ask for his results to be sent from Dr. Duanne Moron office -for now, can continue pravastatin, but given his AAA would consider changing to rosuvastatin or atorvastatin in the future  Cardiac risk counseling and prevention recommendations: -recommend heart healthy/Mediterranean diet, with whole grains, fruits, vegetable, fish, lean meats, nuts, and olive oil. Limit salt. -recommend moderate walking, 3-5 times/week for 30-50 minutes  each session. Aim for at least 150 minutes.week. Goal should be pace of 3 miles/hours, or walking 1.5 miles in 30 minutes -recommend avoidance of tobacco products. Avoid excess alcohol.  -ASCVD risk score: The 10-year ASCVD risk score Mikey Bussing DC Brooke Bonito., et al., 2013) is: 14.1%   Values used to calculate the score:     Age: 66 years     Sex: Male     Is Non-Hispanic African American: Yes     Diabetic: No     Tobacco smoker: No     Systolic Blood Pressure: 876 mmHg     Is BP treated: Yes     HDL Cholesterol: 45 MG/DL     Total Cholesterol: 162 MG/DL    Plan for follow up: 1 year or sooner as needed  Buford Dresser, MD, PhD [place]  [facility]    Medication Adjustments/Labs and Tests Ordered: Current medicines are reviewed at length with  the patient today.  Concerns regarding medicines are outlined above.  Orders Placed This Encounter  Procedures  . EKG 12-Lead   No orders of the defined types were placed in this encounter.   Patient Instructions  Medication Instructions:  Your Physician recommend you continue on your current medication as directed.    *If you need a refill on your cardiac medications before your next appointment, please call your pharmacy*   Lab Work: None   Testing/Procedures: None   Follow-Up: At [facility], you and your health needs are our priority.  As part of our continuing mission to provide you with exceptional heart Sandoval, we have created designated Provider Sandoval Teams.  These Sandoval Teams include your primary Cardiologist (physician) and Advanced Practice Providers (APPs -  Physician Assistants and Nurse Practitioners) who all work together to provide you with the Sandoval you need, when you need it.  We recommend signing up for the patient portal called "MyChart".  Sign up information is provided on this After Visit Summary.  MyChart is used to connect with patients for Virtual Visits (Telemedicine).  Patients are able to view lab/test results, encounter notes, upcoming appointments, etc.  Non-urgent messages can be sent to your provider as well.   To learn more about what you can do with MyChart, go to [email or web address].    Your next appointment:   1 year(s)  The format for your next appointment:   In Person  Provider:   Buford Dresser, MD      Signed, Buford Dresser, MD PhD 05/08/2019    [place]

## 2019-05-08 NOTE — Patient Instructions (Signed)

## 2019-05-09 ENCOUNTER — Encounter: Payer: Self-pay | Admitting: Cardiology

## 2019-05-22 ENCOUNTER — Telehealth (HOSPITAL_COMMUNITY): Payer: Self-pay

## 2019-05-22 NOTE — Telephone Encounter (Signed)
The above patient or their representative was contacted and gave the following answers to these questions:         Do you have any of the following symptoms?    NO  Fever                    Cough                   Shortness of breath  Do  you have any of the following other symptoms?    muscle pain         vomiting,        diarrhea        rash         weakness        red eye        abdominal pain         bruising          bruising or bleeding              joint pain           severe headache    Have you been in contact with someone who was or has been sick in the past 2 weeks?  NO  Yes                 Unsure                         Unable to assess   Does the person that you were in contact with have any of the following symptoms?   Cough         shortness of breath           muscle pain         vomiting,            diarrhea            rash            weakness           fever            red eye           abdominal pain           bruising  or  bleeding                joint pain                severe headache                 COMMENTS OR ACTION PLAN FOR THIS PATIENT:        ALL QUESTIONS WERE ANSWERED/[facility] PT IS FULLY VACCINATED/[facility]  

## 2019-05-26 ENCOUNTER — Encounter: Payer: Self-pay | Admitting: Surgery

## 2019-05-26 ENCOUNTER — Ambulatory Visit (INDEPENDENT_AMBULATORY_CARE_PROVIDER_SITE_OTHER)
Admission: RE | Admit: 2019-05-26 | Discharge: 2019-05-26 | Disposition: A | Discharge status: Home / Self Care | Payer: Medicare Other | Source: Ambulatory Visit | Attending: Surgery | Admitting: Surgery

## 2019-05-26 ENCOUNTER — Other Ambulatory Visit: Payer: Self-pay

## 2019-05-26 ENCOUNTER — Ambulatory Visit (INDEPENDENT_AMBULATORY_CARE_PROVIDER_SITE_OTHER): Payer: Medicare Other | Admitting: Surgery

## 2019-05-26 ENCOUNTER — Ambulatory Visit (HOSPITAL_COMMUNITY)
Admission: RE | Admit: 2019-05-26 | Discharge: 2019-05-26 | Disposition: A | Discharge status: Home / Self Care | Payer: Medicare Other | Source: Ambulatory Visit | Attending: Surgery | Admitting: Surgery

## 2019-05-26 VITALS — BP 132/88 | HR 71 | Temp 98.1°F | Resp 18 | Ht 72.0 in | Wt 212.0 lb

## 2019-05-26 DIAGNOSIS — I723 Aneurysm of iliac artery: Secondary | ICD-10-CM

## 2019-05-26 DIAGNOSIS — I739 Peripheral vascular disease, unspecified: Secondary | ICD-10-CM

## 2019-05-26 DIAGNOSIS — Z01818 Encounter for other preprocedural examination: Secondary | ICD-10-CM

## 2019-05-26 DIAGNOSIS — I714 Abdominal aortic aneurysm, without rupture, unspecified: Secondary | ICD-10-CM

## 2019-05-26 NOTE — H&P (View-Only) (Signed)
[facility]  Patient name: Lawrence Sandoval MRN: [id number] DOB: [birth year] Sex: male   REASON FOR VISIT:    Follow up  HISOTRY OF PRESENT ILLNESS:    Lawrence Sandoval is a 64 y.o. male that I have been following for a infrarenal abdominal aortic aneurysm.  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  He has had one episode of back pain which has resolved spontaneously, around the time of his Covid shot.  He continues to take a statin for hypercholesterolemia.  He is medically managed for hypertension.  He is a former smoker.  He has been seen and evaluated by cardiology and cleared to proceed with surgery.   PAST MEDICAL HISTORY:   Past Medical History:  Diagnosis Date  . AAA (abdominal aortic aneurysm) (HCC)   . Depression   . Hyperlipidemia   . Hypertension   . Hyperthyroidism   . Lumbago      FAMILY HISTORY:   Family History  Problem Relation Age of Onset  . Heart disease Mother        CHF  . Alcohol abuse Father   . Hypertension Father   . Stroke Maternal Grandfather     SOCIAL HISTORY:   Social History   Tobacco Use  . Smoking status: Former Smoker    Years: 35.00    Types: Cigarettes    Quit date: 02/18/2003    Years since quitting: 16.2  . Smokeless tobacco: Never Used  Substance Use Topics  . Alcohol use: No    Comment: quit abusing alcohol about 10 years ago     ALLERGIES:   No Known Allergies   CURRENT MEDICATIONS:   Current Outpatient Medications  Medication Sig Dispense Refill  . cetirizine (ZYRTEC) 10 MG tablet Take 10 mg by mouth daily.    Marland Kitchen diltiazem (CARDIZEM CD) 180 MG 24 hr capsule Take 180 mg by mouth daily.    . methimazole (TAPAZOLE) 5 MG tablet Take 5 mg by mouth daily.     Marland Kitchen omeprazole (PRILOSEC) 20 MG capsule Take by mouth.    . pravastatin (PRAVACHOL) 20  MG tablet Take 20 mg by mouth daily.    Marland Kitchen atenolol (TENORMIN) 50 MG tablet Take 50 mg by mouth daily.     No current facility-administered medications for this visit.    REVIEW OF SYSTEMS:   [X]  denotes positive finding, [ ]  denotes negative finding Cardiac  Comments:  Chest pain or chest pressure:    Shortness of breath upon exertion:    Short of breath when lying flat:    Irregular heart rhythm:        Vascular    Pain in calf, thigh, or hip brought on by ambulation:    Pain in feet at night that wakes you up from your sleep:     Blood clot in your veins:    Leg swelling:         Pulmonary    Oxygen at home:    Productive cough:     Wheezing:         Neurologic    Sudden weakness in arms or legs:     Sudden numbness in arms or legs:     Sudden onset of difficulty speaking or slurred speech:    Temporary loss of vision in one eye:     Problems with dizziness:  Gastrointestinal    Blood in stool:     Vomited blood:         Genitourinary    Burning when urinating:     Blood in urine:        Psychiatric    Major depression:         Hematologic    Bleeding problems:    Problems with blood clotting too easily:        Skin    Rashes or ulcers:        Constitutional    Fever or chills:      PHYSICAL EXAM:   Vitals:   05/26/19 1417 05/26/19 1421  BP: (!) 130/92 132/88  Pulse: 71 71  Resp: 18   Temp: 98.1 F (36.7 C)   TempSrc: Temporal   SpO2: 99%   Weight: 212 lb (96.2 kg)   Height: 6' (1.829 m)     GENERAL: The patient is a well-nourished male, in no acute distress. The vital signs are documented above. CARDIAC: There is a regular rate and rhythm.  VASCULAR: Palpable pedal pulses PULMONARY: Non-labored respirations ABDOMEN: Soft and non-tender with normal pitched bowel sounds.  MUSCULOSKELETAL: There are no major deformities or cyanosis. NEUROLOGIC: No focal weakness or paresthesias are detected. SKIN: There are no ulcers or rashes  noted. PSYCHIATRIC: The patient has a normal affect.  STUDIES:   I have reviewed his studies with the following results  CTA: Maximum aortic diameter 5 cm Carotid: Right Carotid: Velocities in the right ICA are consistent with a 1-39%  stenosis.         Non-hemodynamically significant plaque <50% noted in the  CCA. The         ECA appears <50% stenosed.   Left Carotid: Velocities in the left ICA are consistent with a 1-39%  stenosis.        Non-hemodynamically significant plaque <50% noted in the  CCA. The        ECA appears <50% stenosed.   Vertebrals: Bilateral vertebral arteries demonstrate antegrade flow.  Subclavians: Normal flow hemodynamics were seen in bilateral subclavian        arteries.   Lower extremity Doppler: Right: Normal examination. No evidence of arterial occlusive disease.   Left: Normal examination. No evidence of arterial occlusive disease.  +-------+-----------+-----------+------------+------------+  ABI/TBIToday's ABIToday's TBIPrevious ABIPrevious TBI  +-------+-----------+-----------+------------+------------+  Right 1.19    0.90                  +-------+-----------+-----------+------------+------------+  Left  1.20    0.78                  +-------+-----------+-----------+------------+------------+  MEDICAL ISSUES:   AAA:  We discussed proceeding with endovascular repair.  This will require coil embolization of the right hypogastric artery.  I discussed the risk benefits the operation including the risk of buttock claudication from hypogastric embolization.  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  We discussed the immediate risks of bleeding, renal, intestinal, and lower extremity arterial issues.  All of his questions were  answered.  He would like to get this done as soon as possible.  I scheduled him for Wednesday, April 14.    Wells Aura Bibby, IV, MD, FACS [facility] [place] Tel [phone] Pager [phone] 

## 2019-05-26 NOTE — Progress Notes (Signed)
[facility]  Patient name: Lawrence Sandoval MRN: [id number] DOB: [birth year] Sex: male   REASON FOR VISIT:    Follow up  HISOTRY OF PRESENT ILLNESS:    Lawrence Sandoval is a 64 y.o. male that I have been following for a infrarenal abdominal aortic aneurysm.  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  He has had one episode of back pain which has resolved spontaneously, around the time of his Covid shot.  He continues to take a statin for hypercholesterolemia.  He is medically managed for hypertension.  He is a former smoker.  He has been seen and evaluated by cardiology and cleared to proceed with surgery.   PAST MEDICAL HISTORY:   Past Medical History:  Diagnosis Date  . AAA (abdominal aortic aneurysm) (HCC)   . Depression   . Hyperlipidemia   . Hypertension   . Hyperthyroidism   . Lumbago      FAMILY HISTORY:   Family History  Problem Relation Age of Onset  . Heart disease Mother        CHF  . Alcohol abuse Father   . Hypertension Father   . Stroke Maternal Grandfather     SOCIAL HISTORY:   Social History   Tobacco Use  . Smoking status: Former Smoker    Years: 35.00    Types: Cigarettes    Quit date: 02/18/2003    Years since quitting: 16.2  . Smokeless tobacco: Never Used  Substance Use Topics  . Alcohol use: No    Comment: quit abusing alcohol about 10 years ago     ALLERGIES:   No Known Allergies   CURRENT MEDICATIONS:   Current Outpatient Medications  Medication Sig Dispense Refill  . cetirizine (ZYRTEC) 10 MG tablet Take 10 mg by mouth daily.    Marland Kitchen diltiazem (CARDIZEM CD) 180 MG 24 hr capsule Take 180 mg by mouth daily.    . methimazole (TAPAZOLE) 5 MG tablet Take 5 mg by mouth daily.     Marland Kitchen omeprazole (PRILOSEC) 20 MG capsule Take by mouth.    . pravastatin (PRAVACHOL) 20  MG tablet Take 20 mg by mouth daily.    Marland Kitchen atenolol (TENORMIN) 50 MG tablet Take 50 mg by mouth daily.     No current facility-administered medications for this visit.    REVIEW OF SYSTEMS:   [X]  denotes positive finding, [ ]  denotes negative finding Cardiac  Comments:  Chest pain or chest pressure:    Shortness of breath upon exertion:    Short of breath when lying flat:    Irregular heart rhythm:        Vascular    Pain in calf, thigh, or hip brought on by ambulation:    Pain in feet at night that wakes you up from your sleep:     Blood clot in your veins:    Leg swelling:         Pulmonary    Oxygen at home:    Productive cough:     Wheezing:         Neurologic    Sudden weakness in arms or legs:     Sudden numbness in arms or legs:     Sudden onset of difficulty speaking or slurred speech:    Temporary loss of vision in one eye:     Problems with dizziness:  Gastrointestinal    Blood in stool:     Vomited blood:         Genitourinary    Burning when urinating:     Blood in urine:        Psychiatric    Major depression:         Hematologic    Bleeding problems:    Problems with blood clotting too easily:        Skin    Rashes or ulcers:        Constitutional    Fever or chills:      PHYSICAL EXAM:   Vitals:   05/26/19 1417 05/26/19 1421  BP: (!) 130/92 132/88  Pulse: 71 71  Resp: 18   Temp: 98.1 F (36.7 C)   TempSrc: Temporal   SpO2: 99%   Weight: 212 lb (96.2 kg)   Height: 6' (1.829 m)     GENERAL: The patient is a well-nourished male, in no acute distress. The vital signs are documented above. CARDIAC: There is a regular rate and rhythm.  VASCULAR: Palpable pedal pulses PULMONARY: Non-labored respirations ABDOMEN: Soft and non-tender with normal pitched bowel sounds.  MUSCULOSKELETAL: There are no major deformities or cyanosis. NEUROLOGIC: No focal weakness or paresthesias are detected. SKIN: There are no ulcers or rashes  noted. PSYCHIATRIC: The patient has a normal affect.  STUDIES:   I have reviewed his studies with the following results  CTA: Maximum aortic diameter 5 cm Carotid: Right Carotid: Velocities in the right ICA are consistent with a 1-39%  stenosis.         Non-hemodynamically significant plaque <50% noted in the  CCA. The         ECA appears <50% stenosed.   Left Carotid: Velocities in the left ICA are consistent with a 1-39%  stenosis.        Non-hemodynamically significant plaque <50% noted in the  CCA. The        ECA appears <50% stenosed.   Vertebrals: Bilateral vertebral arteries demonstrate antegrade flow.  Subclavians: Normal flow hemodynamics were seen in bilateral subclavian        arteries.   Lower extremity Doppler: Right: Normal examination. No evidence of arterial occlusive disease.   Left: Normal examination. No evidence of arterial occlusive disease.  +-------+-----------+-----------+------------+------------+  ABI/TBIToday's ABIToday's TBIPrevious ABIPrevious TBI  +-------+-----------+-----------+------------+------------+  Right 1.19    0.90                  +-------+-----------+-----------+------------+------------+  Left  1.20    0.78                  +-------+-----------+-----------+------------+------------+  MEDICAL ISSUES:   AAA:  We discussed proceeding with endovascular repair.  This will require coil embolization of the right hypogastric artery.  I discussed the risk benefits the operation including the risk of buttock claudication from hypogastric embolization.  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  We discussed the immediate risks of bleeding, renal, intestinal, and lower extremity arterial issues.  All of his questions were  answered.  He would like to get this done as soon as possible.  I scheduled him for Wednesday, April 14.    Leia Alf, MD, FACS [facility] [phone] Pager [phone]

## 2019-06-03 NOTE — Progress Notes (Signed)
[facility] [address] - [place], [place] - [address] [place] [address] Phone: [phone] Fax: [phone]      Your procedure is scheduled on Friday, April 16th.  Report to [facility] Main Entrance "A" at 5:30 A.M., and check in at the Admitting office.  Call this number if you have problems the morning of surgery:  [phone]  Call [phone] if you have any questions prior to your surgery date Monday-Friday 8am-4pm    Remember:  Do not eat or drink after midnight the night before your surgery     Take these medicines the morning of surgery with A SIP OF WATER   Tylenol - if needed  Zyrtec - if needed  Methamazole (Tapazole)  Omeprazole (Prilosec)  Pravastatin (Pravachol)  Diltiazem (Cardizem)     As of today, STOP taking any Aspirin (unless otherwise instructed by your surgeon) and Aspirin containing products, Aleve, Naproxen, Ibuprofen, Motrin, Advil, Goody's, BC's, all herbal medications, fish oil, and all vitamins.                      Do not wear jewelry.            Do not wear lotions, powders, colognes, or deodorant.            Men may shave face and neck.            Do not bring valuables to the hospital.            [facility] is not responsible for any belongings or valuables.  Do NOT Smoke (Tobacco/Vapping) or drink Alcohol 24 hours prior to your procedure If you use a CPAP at night, you may bring all equipment for your overnight stay.   Contacts, glasses, dentures or bridgework may not be worn into surgery.      For patients admitted to the hospital, discharge time will be determined by your treatment team.   Patients discharged the day of surgery will not be allowed to drive home, and someone needs to stay with them for 24 hours.    Special instructions:   [facility]- Preparing For Surgery  Before surgery, you can play an important role. Because skin is not sterile, your skin needs to be  as free of germs as possible. You can reduce the number of germs on your skin by washing with CHG (chlorahexidine gluconate) Soap before surgery.  CHG is an antiseptic cleaner which kills germs and bonds with the skin to continue killing germs even after washing.    Oral Hygiene is also important to reduce your risk of infection.  Remember - BRUSH YOUR TEETH THE MORNING OF SURGERY WITH YOUR REGULAR TOOTHPASTE  Please do not use if you have an allergy to CHG or antibacterial soaps. If your skin becomes reddened/irritated stop using the CHG.  Do not shave (including legs and underarms) for at least 48 hours prior to first CHG shower. It is OK to shave your face.  Please follow these instructions carefully.   1. Shower the NIGHT BEFORE SURGERY and the MORNING OF SURGERY with CHG Soap.   2. If you chose to wash your hair, wash your hair first as usual with your normal shampoo.  3. After you shampoo, rinse your hair and body thoroughly to remove the shampoo.  4. Use CHG as you would any other liquid soap. You can apply CHG directly to the skin  and wash gently with a scrungie or a clean washcloth.   5. Apply the CHG Soap to your body ONLY FROM THE NECK DOWN.  Do not use on open wounds or open sores. Avoid contact with your eyes, ears, mouth and genitals (private parts). Wash Face and genitals (private parts)  with your normal soap.   6. Wash thoroughly, paying special attention to the area where your surgery will be performed.  7. Thoroughly rinse your body with warm water from the neck down.  8. DO NOT shower/wash with your normal soap after using and rinsing off the CHG Soap.  9. Pat yourself dry with a CLEAN TOWEL.  10. Wear CLEAN PAJAMAS to bed the night before surgery, wear comfortable clothes the morning of surgery  11. Place CLEAN SHEETS on your bed the night of your first shower and DO NOT SLEEP WITH PETS.   Day of Surgery:   Do not apply any deodorants/lotions.  Please wear  clean clothes to the hospital/surgery center.   Remember to brush your teeth WITH YOUR REGULAR TOOTHPASTE.   Please read over the following fact sheets that you were given.

## 2019-06-04 ENCOUNTER — Encounter (HOSPITAL_COMMUNITY)
Admission: RE | Admit: 2019-06-04 | Discharge: 2019-06-04 | Disposition: A | Discharge status: Home / Self Care | Payer: Medicare Other | Source: Ambulatory Visit | Attending: Surgery | Admitting: Surgery

## 2019-06-04 ENCOUNTER — Encounter (HOSPITAL_COMMUNITY): Payer: Self-pay

## 2019-06-04 ENCOUNTER — Other Ambulatory Visit: Payer: Self-pay

## 2019-06-04 DIAGNOSIS — Z79899 Other long term (current) drug therapy: Secondary | ICD-10-CM | POA: Insufficient documentation

## 2019-06-04 DIAGNOSIS — Z01818 Encounter for other preprocedural examination: Secondary | ICD-10-CM | POA: Insufficient documentation

## 2019-06-04 HISTORY — DX: Other intervertebral disc displacement, lumbar region: M51.26

## 2019-06-04 HISTORY — DX: Sleep apnea, unspecified: G47.30

## 2019-06-04 LAB — URINALYSIS, ROUTINE W REFLEX MICROSCOPIC
Bilirubin Urine: NEGATIVE
Glucose, UA: NEGATIVE mg/dL
Hgb urine dipstick: NEGATIVE
Ketones, ur: NEGATIVE mg/dL
Leukocytes,Ua: NEGATIVE
Nitrite: NEGATIVE
Protein, ur: NEGATIVE mg/dL
Specific Gravity, Urine: 1.011 (ref 1.005–1.030)
pH: 7 (ref 5.0–8.0)

## 2019-06-04 LAB — COMPREHENSIVE METABOLIC PANEL
ALT: 24 U/L (ref 0–44)
AST: 19 U/L (ref 15–41)
Albumin: 3.9 g/dL (ref 3.5–5.0)
Alkaline Phosphatase: 117 U/L (ref 38–126)
Anion gap: 9 (ref 5–15)
BUN: 10 mg/dL (ref 8–23)
CO2: 24 mmol/L (ref 22–32)
Calcium: 9.5 mg/dL (ref 8.9–10.3)
Chloride: 107 mmol/L (ref 98–111)
Creatinine, Ser: 0.94 mg/dL (ref 0.61–1.24)
GFR calc Af Amer: >60 mL/min (ref >60)
GFR calc non Af Amer: >60 mL/min (ref >60)
Glucose, Bld: 104 mg/dL — ABNORMAL HIGH (ref 70–99)
Potassium: 4.5 mmol/L (ref 3.5–5.1)
Sodium: 140 mmol/L (ref 135–145)
Total Bilirubin: 0.3 mg/dL (ref 0.3–1.2)
Total Protein: 7.5 g/dL (ref 6.5–8.1)

## 2019-06-04 LAB — CBC
HCT: 45.8 % (ref 39.0–52.0)
Hemoglobin: 14.3 g/dL (ref 13.0–17.0)
MCH: 28.3 pg (ref 26.0–34.0)
MCHC: 31.2 g/dL (ref 30.0–36.0)
MCV: 90.5 fL (ref 80.0–100.0)
Platelets: 240 10*3/uL (ref 150–400)
RBC: 5.06 MIL/uL (ref 4.22–5.81)
RDW: 14.9 % (ref 11.5–15.5)
WBC: 7.2 10*3/uL (ref 4.0–10.5)
nRBC: 0 % (ref 0.0–0.2)

## 2019-06-04 LAB — PROTIME-INR
INR: 0.9 (ref 0.8–1.2)
Prothrombin Time: 12 seconds (ref 11.4–15.2)

## 2019-06-04 LAB — SURGICAL PCR SCREEN
MRSA, PCR: NEGATIVE
Staphylococcus aureus: NEGATIVE

## 2019-06-04 LAB — TYPE AND SCREEN
ABO/RH(D): O POS
Antibody Screen: NEGATIVE

## 2019-06-04 LAB — ABO/RH: ABO/RH(D): O POS

## 2019-06-04 LAB — APTT: aPTT: 31 seconds (ref 24–36)

## 2019-06-04 NOTE — Progress Notes (Signed)
Anesthesia Chart Review:  Pt BP elevated at PAT 161/101 on recheck. Pt states usually not this high, he monitors at home. Review of recent records does show it is generally better controlled, and says he is having significant back pain today which he thinks is contributing. There does seem to be some misunderstanding regarding antiHTN meds. He was recently seen by Dr. Jodelle Red for preop clearance (pt was cleared for surgery with RCRI score = 1), and her note states the pt is on atenolol and diltiazem. BP was 131/92 at that visit. Today, the pt reports he is only on diltiazem. His recollection is that he was previously on atenolol only and then that was dc'd and he was switched to diltiazem by his PCP Dr. Jaynie Collins. He does not currently have any atenolol, but did take his diltiazem today. He was advised to continue to monitor his BP at home and to followup with PCP regarding his medication regimen. He understands that markedly elevated BP on DOS could be cause for cancellation. I sent a message to Dr. Estanislado Spire scheduler to make him aware of elevated BP.   BP Readings from Last 3 Encounters:  06/04/19 (!) 161/101  05/26/19 132/88  05/08/19 (!) 131/92    Per Dr. Di Kindle clearance 05/08/19, "Preoperative cardiovascular risk: Based on available date, patient's RCRI score = 1 only for vascular surgery, which carries a 6% 30-day risk of death, MI, or cardiac arrest. The patient is not currently having active cardiac symptoms, and they can achieve >4 METs of activity. According to ACC/AHA Guidelines, no further testing is needed.  Proceed with surgery at acceptable risk.  Our service is available as needed in the peri-operative period."  Proep labs reviewed, unremarkable.  EKG done at cardiology visit 05/08/19 is not scanned into Epic, per Dr. Arletta Bale note the tracing showed NSR. Tracing has been requested (usually they are scanned into Epic by their office).  Carotid US 05/26/19: Summary:   Right Carotid: Velocities in the right ICA are consistent with a 1-39% stenosis. Non-hemodynamically significant plaque <50% noted in the CCA. The ECA appears <50% stenosed.   Left Carotid: Velocities in the left ICA are consistent with a 1-39% stenosis. Non-hemodynamically significant plaque <50% noted in the CCA. TheECA appears <50% stenosed.   Vertebrals: Bilateral vertebral arteries demonstrate antegrade flow.  Subclavians: Normal flow hemodynamics were seen in bilateral subclavian arteries.   CTA abd/pelvis 04/03/19: IMPRESSION: 1. 5 cm bilobed infrarenal abdominal aortic aneurysm (previously 4.8) with extension into the right common iliac artery, 2.4 cm diameter. 2. 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 3. 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 4. Negative for acute PE or thoracic aortic dissection. 5. Coronary calcifications. The severity of coronary artery disease and any potential stenosis cannot be assessed on this non-gated CT examination.  6. Stable pulmonary nodules 6 mm or less. Continued surveillance recommended. 7. Stable small hypervascular liver lesions, nonspecific. 8. Descending and sigmoid diverticulosis. 9. Left inguinal hernia containing fat and a portion of the bladder   [facility]/Anesthesiology Phone [phone] 06/05/2019 9:14 AM  wall.

## 2019-06-04 NOTE — Progress Notes (Addendum)
PCP - Verdell Face -[facility] in [place] Cardiologist - Bridgette christopher   Chest x-ray - na EKG - request from [place]-not in epic, Dr. Di Kindle note states it was done 05/21/19 Stress Test - na ECHO - na Cardiac Cath - na  Sleep Study - states he didn't have a study ,the doctor ordered CPAP - yes    Blood Thinner Instructions:  na Aspirin Instructions:    COVID TEST- 4/15   Anesthesia review: heart hx. ,blood pressure recheck 169/109.James burns,PA notified. Pt. To check bp at home and if remains elevated to contact PCP  Patient denies shortness of breath, fever, cough and chest pain at PAT appointment   All instructions explained to the patient, with a verbal understanding of the material. Patient agrees to go over the instructions while at home for a better understanding. Patient also instructed to self quarantine after being tested for COVID-19. The opportunity to ask questions was provided.

## 2019-06-05 ENCOUNTER — Other Ambulatory Visit (HOSPITAL_COMMUNITY)
Admission: RE | Admit: 2019-06-05 | Discharge: 2019-06-05 | Disposition: A | Discharge status: Home / Self Care | Payer: Medicare Other | Source: Ambulatory Visit | Attending: Surgery | Admitting: Surgery

## 2019-06-05 LAB — SARS CORONAVIRUS 2 (TAT 6-24 HRS): SARS Coronavirus 2: NEGATIVE

## 2019-06-05 NOTE — Anesthesia Preprocedure Evaluation (Addendum)
Anesthesia Evaluation  Patient identified by MRN, date of birth, ID band Patient awake    Reviewed: Allergy & Precautions, NPO status , Patient's Chart, lab work & pertinent test results  Airway Mallampati: III  TM Distance: >3 FB Neck ROM: Full    Dental  (+) Edentulous Upper, Edentulous Lower   Pulmonary sleep apnea and Continuous Positive Airway Pressure Ventilation , former smoker,    Pulmonary exam normal breath sounds clear to auscultation       Cardiovascular hypertension, Pt. on medications Normal cardiovascular exam Rhythm:Regular Rate:Normal  ECG: NSR, rate 72  Aorta: Bilobed infrarenal aneurysm. Proximal segment 5 x 4.9 cm maximum transverse dimensions, inferior segment 5 cm. Nonocclusive eccentric mural thrombus in the inferior component. No dissection or stenosis.   Neuro/Psych negative neurological ROS  negative psych ROS   GI/Hepatic Neg liver ROS, GERD  Medicated and Controlled,  Endo/Other  Hyperthyroidism   Renal/GU negative Renal ROS     Musculoskeletal negative musculoskeletal ROS (+)   Abdominal   Peds  Hematology HLD   Anesthesia Other Findings ABDOMINAL AORTIC ANEURYSM  Reproductive/Obstetrics                           Anesthesia Physical Anesthesia Plan  ASA: IV  Anesthesia Plan: General   Post-op Pain Management:    Induction: Intravenous  PONV Risk Score and Plan: 2 and Ondansetron, Dexamethasone, Midazolam and Treatment may vary due to age or medical condition  Airway Management Planned: Oral ETT  Additional Equipment: Arterial line  Intra-op Plan:   Post-operative Plan: Extubation in OR  Informed Consent: I have reviewed the patients History and Physical, chart, labs and discussed the procedure including the risks, benefits and alternatives for the proposed anesthesia with the patient or authorized representative who has indicated his/her  understanding and acceptance.       Plan Discussed with: CRNA  Anesthesia Plan Comments: (Per PA-C:  Pt BP elevated at PAT 161/101 on recheck. Pt states usually not this high, he monitors at home. Review of recent records does show it is generally better controlled, and says he is having significant back pain today which he thinks is contributing. There does seem to be some misunderstanding regarding antiHTN meds. He was recently seen by Dr. Buford Dresser for preop clearance (pt was cleared for surgery with RCRI score = 1), and her note states the pt is on atenolol and diltiazem. BP was 131/92 at that visit. Today, the pt reports he is only on diltiazem. His recollection is that he was previously on atenolol only and then that was dc'd and he was switched to diltiazem by his PCP Dr. Wynn Banker. He does not currently have any atenolol, but did take his diltiazem today. He was advised to continue to monitor his BP at home and to followup with PCP regarding his medication regimen. He understands that markedly elevated BP on DOS could be cause for cancellation. I sent a message to Dr. Stephens Shire scheduler to make him aware of elevated BP.   BP Readings from Last 3 Encounters: 06/04/19 (!) 161/101 05/26/19 132/88 05/08/19 (!) 131/92   Per Dr. Judeth Cornfield clearance 05/08/19, "Preoperative cardiovascular risk: Based on available date, patient's RCRI score = 1 only for vascular surgery, which carries a 6% 30-day risk of death, MI, or cardiac arrest. The patient is not currently having active cardiac symptoms, and they can achieve >4 METs of activity. According to ACC/AHA Guidelines, no further testing is needed.  Proceed with surgery at acceptable risk.  Our service is available as needed in the peri-operative period."  Proep labs reviewed, unremarkable.  EKG done at cardiology visit 05/08/19 is not scanned into Epic, per Dr. Arletta Bale note the tracing showed NSR. Tracing has been requested (usually  they are scanned into Epic by their office).  Carotid US 05/26/19: Summary:  Right Carotid: Velocities in the right ICA are consistent with a 1-39% stenosis. Non-hemodynamically significant plaque <50% noted in the CCA. The ECA appears <50% stenosed.   Left Carotid: Velocities in the left ICA are consistent with a 1-39% stenosis. Non-hemodynamically significant plaque <50% noted in the CCA. TheECA appears <50% stenosed.   Vertebrals: Bilateral vertebral arteries demonstrate antegrade flow.  Subclavians: Normal flow hemodynamics were seen in bilateral subclavian arteries.   CTA abd/pelvis 04/03/19: IMPRESSION: 1. 5 cm bilobed infrarenal abdominal aortic aneurysm (previously 4.8) with extension into the right common iliac artery, 2.4 cm diameter. 2. 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 3. 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 4. Negative for acute PE or thoracic aortic dissection. 5. Coronary calcifications. The severity of coronary artery disease and any potential stenosis cannot be assessed on this non-gated CT examination.  6. Stable pulmonary nodules 6 mm or less. Continued surveillance recommended. 7. Stable small hypervascular liver lesions, nonspecific. 8. Descending and sigmoid diverticulosis. 9. Left inguinal hernia containing fat and a portion of the bladder)      Anesthesia Quick Evaluation

## 2019-06-06 ENCOUNTER — Inpatient Hospital Stay (HOSPITAL_COMMUNITY): Payer: Medicare Other

## 2019-06-06 ENCOUNTER — Other Ambulatory Visit: Payer: Self-pay

## 2019-06-06 ENCOUNTER — Inpatient Hospital Stay (HOSPITAL_COMMUNITY)
Admission: RE | Admit: 2019-06-06 | Discharge: 2019-06-07 | DRG: 269 | Disposition: A | Discharge status: Home / Self Care | Payer: Medicare Other | Attending: Surgery | Admitting: Surgery

## 2019-06-06 ENCOUNTER — Encounter (HOSPITAL_COMMUNITY)
Admission: RE | Disposition: A | Discharge status: Home / Self Care | Payer: Self-pay | Source: Home / Self Care | Attending: Surgery

## 2019-06-06 ENCOUNTER — Encounter (HOSPITAL_COMMUNITY): Payer: Self-pay | Admitting: Surgery

## 2019-06-06 ENCOUNTER — Inpatient Hospital Stay (HOSPITAL_COMMUNITY): Payer: Medicare Other | Admitting: Anesthesiology

## 2019-06-06 ENCOUNTER — Inpatient Hospital Stay (HOSPITAL_COMMUNITY): Payer: Medicare Other | Admitting: Physician Assistant

## 2019-06-06 DIAGNOSIS — I714 Abdominal aortic aneurysm, without rupture, unspecified: Secondary | ICD-10-CM | POA: Diagnosis present

## 2019-06-06 DIAGNOSIS — Z9889 Other specified postprocedural states: Secondary | ICD-10-CM

## 2019-06-06 DIAGNOSIS — I10 Essential (primary) hypertension: Secondary | ICD-10-CM | POA: Diagnosis present

## 2019-06-06 DIAGNOSIS — G4730 Sleep apnea, unspecified: Secondary | ICD-10-CM | POA: Diagnosis present

## 2019-06-06 DIAGNOSIS — Z87891 Personal history of nicotine dependence: Secondary | ICD-10-CM

## 2019-06-06 DIAGNOSIS — E0590 Thyrotoxicosis, unspecified without thyrotoxic crisis or storm: Secondary | ICD-10-CM | POA: Diagnosis present

## 2019-06-06 DIAGNOSIS — Z811 Family history of alcohol abuse and dependence: Secondary | ICD-10-CM | POA: Diagnosis not present

## 2019-06-06 DIAGNOSIS — E785 Hyperlipidemia, unspecified: Secondary | ICD-10-CM | POA: Diagnosis present

## 2019-06-06 DIAGNOSIS — I723 Aneurysm of iliac artery: Secondary | ICD-10-CM | POA: Diagnosis present

## 2019-06-06 DIAGNOSIS — E7800 Pure hypercholesterolemia, unspecified: Secondary | ICD-10-CM | POA: Diagnosis present

## 2019-06-06 DIAGNOSIS — Z8249 Family history of ischemic heart disease and other diseases of the circulatory system: Secondary | ICD-10-CM | POA: Diagnosis not present

## 2019-06-06 DIAGNOSIS — Z20822 Contact with and (suspected) exposure to covid-19: Secondary | ICD-10-CM | POA: Diagnosis present

## 2019-06-06 DIAGNOSIS — Z823 Family history of stroke: Secondary | ICD-10-CM | POA: Diagnosis not present

## 2019-06-06 DIAGNOSIS — M5126 Other intervertebral disc displacement, lumbar region: Secondary | ICD-10-CM | POA: Diagnosis present

## 2019-06-06 DIAGNOSIS — K219 Gastro-esophageal reflux disease without esophagitis: Secondary | ICD-10-CM | POA: Diagnosis present

## 2019-06-06 HISTORY — PX: ABDOMINAL AORTIC ENDOVASCULAR STENT GRAFT: SHX5707

## 2019-06-06 HISTORY — PX: ULTRASOUND GUIDANCE FOR VASCULAR ACCESS: SHX6516

## 2019-06-06 LAB — BASIC METABOLIC PANEL
Anion gap: 11 (ref 5–15)
BUN: 8 mg/dL (ref 8–23)
CO2: 22 mmol/L (ref 22–32)
Calcium: 9.2 mg/dL (ref 8.9–10.3)
Chloride: 105 mmol/L (ref 98–111)
Creatinine, Ser: 0.92 mg/dL (ref 0.61–1.24)
GFR calc Af Amer: >60 mL/min (ref >60)
GFR calc non Af Amer: >60 mL/min (ref >60)
Glucose, Bld: 131 mg/dL — ABNORMAL HIGH (ref 70–99)
Potassium: 4.3 mmol/L (ref 3.5–5.1)
Sodium: 138 mmol/L (ref 135–145)

## 2019-06-06 LAB — CBC
HCT: 43.5 % (ref 39.0–52.0)
Hemoglobin: 14.3 g/dL (ref 13.0–17.0)
MCH: 28.5 pg (ref 26.0–34.0)
MCHC: 32.9 g/dL (ref 30.0–36.0)
MCV: 86.8 fL (ref 80.0–100.0)
Platelets: 235 10*3/uL (ref 150–400)
RBC: 5.01 MIL/uL (ref 4.22–5.81)
RDW: 14.7 % (ref 11.5–15.5)
WBC: 9.1 10*3/uL (ref 4.0–10.5)
nRBC: 0 % (ref 0.0–0.2)

## 2019-06-06 LAB — PROTIME-INR
INR: 0.9 (ref 0.8–1.2)
Prothrombin Time: 12.5 seconds (ref 11.4–15.2)

## 2019-06-06 LAB — MAGNESIUM: Magnesium: 2 mg/dL (ref 1.7–2.4)

## 2019-06-06 LAB — APTT: aPTT: 31 seconds (ref 24–36)

## 2019-06-06 SURGERY — INSERTION, ENDOVASCULAR STENT GRAFT, AORTA, ABDOMINAL
Anesthesia: General | Site: Groin

## 2019-06-06 MED ORDER — PROTAMINE SULFATE 10 MG/ML IV SOLN
INTRAVENOUS | Status: DC | PRN
  Administered 2019-06-06: 50 mg via INTRAVENOUS

## 2019-06-06 MED ORDER — CEFAZOLIN SODIUM-DEXTROSE 2-4 GM/100ML-% IV SOLN
2.0000 g | Freq: Three times a day (TID) | INTRAVENOUS | Status: AC
  Administered 2019-06-06 – 2019-06-07 (×2): 2 g via INTRAVENOUS
  Filled 2019-06-06 (×2): qty 100

## 2019-06-06 MED ORDER — ACETAMINOPHEN 325 MG RE SUPP: 325.0000 mg | RECTAL | Status: DC | PRN

## 2019-06-06 MED ORDER — ROCURONIUM BROMIDE 10 MG/ML (PF) SYRINGE
PREFILLED_SYRINGE | INTRAVENOUS | Status: DC | PRN
  Administered 2019-06-06: 20 mg via INTRAVENOUS
  Administered 2019-06-06: 60 mg via INTRAVENOUS
  Administered 2019-06-06: 20 mg via INTRAVENOUS

## 2019-06-06 MED ORDER — METOPROLOL TARTRATE 5 MG/5ML IV SOLN: 2.0000 mg | INTRAVENOUS | Status: DC | PRN

## 2019-06-06 MED ORDER — ESMOLOL HCL 100 MG/10ML IV SOLN
INTRAVENOUS | Status: DC | PRN
  Administered 2019-06-06: 30 mg via INTRAVENOUS

## 2019-06-06 MED ORDER — PROMETHAZINE HCL 25 MG/ML IJ SOLN: 6.2500 mg | INTRAMUSCULAR | Status: DC | PRN

## 2019-06-06 MED ORDER — ONDANSETRON HCL 4 MG/2ML IJ SOLN: 4.0000 mg | Freq: Four times a day (QID) | INTRAMUSCULAR | Status: DC | PRN

## 2019-06-06 MED ORDER — HYDROMORPHONE HCL 1 MG/ML IJ SOLN: 0.5000 mg | INTRAMUSCULAR | Status: DC | PRN

## 2019-06-06 MED ORDER — SODIUM CHLORIDE 0.9 % IV SOLN
INTRAVENOUS | Status: AC
  Filled 2019-06-06: qty 1.2

## 2019-06-06 MED ORDER — ACETAMINOPHEN 500 MG PO TABS
1000.0000 mg | ORAL_TABLET | Freq: Once | ORAL | Status: AC
  Administered 2019-06-06: 1000 mg via ORAL
  Filled 2019-06-06: qty 2

## 2019-06-06 MED ORDER — HEPARIN SODIUM (PORCINE) 5000 UNIT/ML IJ SOLN: 5000.0000 [IU] | Freq: Three times a day (TID) | INTRAMUSCULAR | Status: DC

## 2019-06-06 MED ORDER — BISACODYL 5 MG PO TBEC: 5.0000 mg | DELAYED_RELEASE_TABLET | Freq: Every day | ORAL | Status: DC | PRN

## 2019-06-06 MED ORDER — CEFAZOLIN SODIUM-DEXTROSE 2-4 GM/100ML-% IV SOLN
2.0000 g | INTRAVENOUS | Status: AC
  Administered 2019-06-06: 08:00:00 2 g via INTRAVENOUS
  Filled 2019-06-06: qty 100

## 2019-06-06 MED ORDER — HYDROMORPHONE HCL 1 MG/ML IJ SOLN: 0.2500 mg | INTRAMUSCULAR | Status: DC | PRN

## 2019-06-06 MED ORDER — MIDAZOLAM HCL 2 MG/2ML IJ SOLN
INTRAMUSCULAR | Status: AC
  Filled 2019-06-06: qty 2

## 2019-06-06 MED ORDER — PHENYLEPHRINE HCL-NACL 10-0.9 MG/250ML-% IV SOLN
INTRAVENOUS | Status: DC | PRN
  Administered 2019-06-06: 30 ug/min via INTRAVENOUS

## 2019-06-06 MED ORDER — LABETALOL HCL 5 MG/ML IV SOLN: 10.0000 mg | INTRAVENOUS | Status: DC | PRN

## 2019-06-06 MED ORDER — LIDOCAINE 2% (20 MG/ML) 5 ML SYRINGE
INTRAMUSCULAR | Status: DC | PRN
  Administered 2019-06-06: 60 mg via INTRAVENOUS

## 2019-06-06 MED ORDER — SODIUM CHLORIDE 0.9 % IV SOLN: 500.0000 mL | Freq: Once | INTRAVENOUS | Status: DC | PRN

## 2019-06-06 MED ORDER — PANTOPRAZOLE SODIUM 40 MG PO TBEC
40.0000 mg | DELAYED_RELEASE_TABLET | Freq: Every day | ORAL | Status: DC
  Administered 2019-06-06 – 2019-06-07 (×2): 40 mg via ORAL
  Filled 2019-06-06 (×2): qty 1

## 2019-06-06 MED ORDER — ASPIRIN EC 81 MG PO TBEC
81.0000 mg | DELAYED_RELEASE_TABLET | Freq: Every day | ORAL | Status: DC
  Administered 2019-06-07: 81 mg via ORAL
  Filled 2019-06-06: qty 1

## 2019-06-06 MED ORDER — FENTANYL CITRATE (PF) 100 MCG/2ML IJ SOLN
INTRAMUSCULAR | Status: DC | PRN
  Administered 2019-06-06 (×2): 50 ug via INTRAVENOUS
  Administered 2019-06-06: 100 ug via INTRAVENOUS
  Administered 2019-06-06: 50 ug via INTRAVENOUS

## 2019-06-06 MED ORDER — SODIUM CHLORIDE 0.9 % IV SOLN: INTRAVENOUS | Status: DC

## 2019-06-06 MED ORDER — OXYCODONE HCL 5 MG PO TABS: 5.0000 mg | ORAL_TABLET | Freq: Once | ORAL | Status: DC | PRN

## 2019-06-06 MED ORDER — IODIXANOL 320 MG/ML IV SOLN
INTRAVENOUS | Status: DC | PRN
  Administered 2019-06-06: 110 mL via INTRA_ARTERIAL

## 2019-06-06 MED ORDER — PRAVASTATIN SODIUM 40 MG PO TABS
40.0000 mg | ORAL_TABLET | Freq: Every day | ORAL | Status: DC
  Administered 2019-06-06: 21:00:00 40 mg via ORAL
  Filled 2019-06-06: qty 1

## 2019-06-06 MED ORDER — DILTIAZEM HCL ER COATED BEADS 180 MG PO CP24
180.0000 mg | ORAL_CAPSULE | Freq: Every day | ORAL | Status: DC
  Administered 2019-06-07: 180 mg via ORAL
  Filled 2019-06-06: qty 1

## 2019-06-06 MED ORDER — POTASSIUM CHLORIDE CRYS ER 20 MEQ PO TBCR: 20.0000 meq | EXTENDED_RELEASE_TABLET | Freq: Every day | ORAL | Status: DC | PRN

## 2019-06-06 MED ORDER — OXYCODONE HCL 5 MG/5ML PO SOLN: 5.0000 mg | Freq: Once | ORAL | Status: DC | PRN

## 2019-06-06 MED ORDER — SUGAMMADEX SODIUM 200 MG/2ML IV SOLN
INTRAVENOUS | Status: DC | PRN
  Administered 2019-06-06: 200 mg via INTRAVENOUS

## 2019-06-06 MED ORDER — HYDRALAZINE HCL 20 MG/ML IJ SOLN: 5.0000 mg | INTRAMUSCULAR | Status: DC | PRN

## 2019-06-06 MED ORDER — DEXAMETHASONE SODIUM PHOSPHATE 10 MG/ML IJ SOLN
INTRAMUSCULAR | Status: DC | PRN
  Administered 2019-06-06: 5 mg via INTRAVENOUS

## 2019-06-06 MED ORDER — FLEET ENEMA 7-19 GM/118ML RE ENEM: 1.0000 | ENEMA | Freq: Once | RECTAL | Status: DC | PRN

## 2019-06-06 MED ORDER — MAGNESIUM SULFATE 2 GM/50ML IV SOLN: 2.0000 g | Freq: Every day | INTRAVENOUS | Status: DC | PRN

## 2019-06-06 MED ORDER — SENNOSIDES-DOCUSATE SODIUM 8.6-50 MG PO TABS: 1.0000 | ORAL_TABLET | Freq: Every evening | ORAL | Status: DC | PRN

## 2019-06-06 MED ORDER — SODIUM CHLORIDE 0.9 % IV SOLN
INTRAVENOUS | Status: DC | PRN
  Administered 2019-06-06: 500 mL

## 2019-06-06 MED ORDER — CHLORHEXIDINE GLUCONATE CLOTH 2 % EX PADS: 6.0000 | MEDICATED_PAD | Freq: Once | CUTANEOUS | Status: DC

## 2019-06-06 MED ORDER — LACTATED RINGERS IV SOLN: INTRAVENOUS | Status: DC | PRN

## 2019-06-06 MED ORDER — ALUM & MAG HYDROXIDE-SIMETH 200-200-20 MG/5ML PO SUSP: 15.0000 mL | ORAL | Status: DC | PRN

## 2019-06-06 MED ORDER — FENTANYL CITRATE (PF) 250 MCG/5ML IJ SOLN
INTRAMUSCULAR | Status: AC
  Filled 2019-06-06: qty 5

## 2019-06-06 MED ORDER — GUAIFENESIN-DM 100-10 MG/5ML PO SYRP: 15.0000 mL | ORAL_SOLUTION | ORAL | Status: DC | PRN

## 2019-06-06 MED ORDER — DOCUSATE SODIUM 100 MG PO CAPS
100.0000 mg | ORAL_CAPSULE | Freq: Every day | ORAL | Status: DC
  Administered 2019-06-07: 09:00:00 100 mg via ORAL
  Filled 2019-06-06: qty 1

## 2019-06-06 MED ORDER — ONDANSETRON HCL 4 MG/2ML IJ SOLN
INTRAMUSCULAR | Status: DC | PRN
  Administered 2019-06-06: 4 mg via INTRAVENOUS

## 2019-06-06 MED ORDER — OXYCODONE HCL 5 MG PO TABS
5.0000 mg | ORAL_TABLET | ORAL | Status: DC | PRN
  Administered 2019-06-06 (×2): 5 mg via ORAL
  Filled 2019-06-06 (×2): qty 1

## 2019-06-06 MED ORDER — PROPOFOL 10 MG/ML IV BOLUS
INTRAVENOUS | Status: AC
  Filled 2019-06-06: qty 40

## 2019-06-06 MED ORDER — HEPARIN SODIUM (PORCINE) 1000 UNIT/ML IJ SOLN
INTRAMUSCULAR | Status: DC | PRN
  Administered 2019-06-06: 1000 [IU] via INTRAVENOUS
  Administered 2019-06-06: 10000 [IU] via INTRAVENOUS
  Administered 2019-06-06: 1000 [IU] via INTRAVENOUS

## 2019-06-06 MED ORDER — MIDAZOLAM HCL 5 MG/5ML IJ SOLN
INTRAMUSCULAR | Status: DC | PRN
  Administered 2019-06-06: 2 mg via INTRAVENOUS

## 2019-06-06 MED ORDER — ACETAMINOPHEN 325 MG PO TABS: 325.0000 mg | ORAL_TABLET | ORAL | Status: DC | PRN

## 2019-06-06 MED ORDER — PROPOFOL 10 MG/ML IV BOLUS
INTRAVENOUS | Status: DC | PRN
  Administered 2019-06-06 (×2): 50 mg via INTRAVENOUS
  Administered 2019-06-06: 100 mg via INTRAVENOUS

## 2019-06-06 MED ORDER — PHENOL 1.4 % MT LIQD: 1.0000 | OROMUCOSAL | Status: DC | PRN

## 2019-06-06 MED ORDER — METHIMAZOLE 5 MG PO TABS
5.0000 mg | ORAL_TABLET | Freq: Every day | ORAL | Status: DC
  Administered 2019-06-07: 5 mg via ORAL
  Filled 2019-06-06: qty 1

## 2019-06-06 SURGICAL SUPPLY — 65 items
BLADE CLIPPER SURG (BLADE) ×4 IMPLANT
CANISTER SUCT 3000ML PPV (MISCELLANEOUS) ×4 IMPLANT
CATH BEACON 5.038 65CM KMP-01 (CATHETERS) ×4 IMPLANT
CATH OMNI FLUSH .035X70CM (CATHETERS) ×4 IMPLANT
COIL IDC 2D .035 10MMX20CM (Embolic) ×4 IMPLANT
COIL IDC 2D .035 12MMX40CM (Embolic) IMPLANT
COIL IDC 360 .035 15MMX25CM (Embolic) ×4 IMPLANT
COIL IDC 360 .035 15MMX40CM (Embolic) ×4 IMPLANT
COVER WAND RF STERILE (DRAPES) IMPLANT
DERMABOND ADHESIVE PROPEN (GAUZE/BANDAGES/DRESSINGS) ×2
DERMABOND ADVANCED (GAUZE/BANDAGES/DRESSINGS) ×2
DERMABOND ADVANCED .7 DNX12 (GAUZE/BANDAGES/DRESSINGS) ×2 IMPLANT
DERMABOND ADVANCED .7 DNX6 (GAUZE/BANDAGES/DRESSINGS) ×2 IMPLANT
DEVICE CLOSURE PERCLS PRGLD 6F (VASCULAR PRODUCTS) ×8 IMPLANT
DEVICE TORQUE H2O (MISCELLANEOUS) ×4 IMPLANT
DRAPE ZERO GRAVITY STERILE (DRAPES) IMPLANT
DRSG TEGADERM 2-3/8X2-3/4 SM (GAUZE/BANDAGES/DRESSINGS) ×8 IMPLANT
DRYSEAL FLEXSHEATH 16FR 33CM (SHEATH) ×4
ELECT CAUTERY BLADE 6.4 (BLADE) ×4 IMPLANT
ELECT REM PT RETURN 9FT ADLT (ELECTROSURGICAL) ×8
ELECTRODE REM PT RTRN 9FT ADLT (ELECTROSURGICAL) ×4 IMPLANT
EXCLDR TRNK ENDO 23X14.5X12 15 (Endovascular Graft) ×4 IMPLANT
EXCLUDER TNK END 23X14.5X12 15 (Endovascular Graft) ×2 IMPLANT
GAUZE SPONGE 2X2 8PLY STRL LF (GAUZE/BANDAGES/DRESSINGS) ×4 IMPLANT
GLIDEWIRE ADV .035X180CM (WIRE) ×4 IMPLANT
GLOVE BIOGEL PI IND STRL 7.5 (GLOVE) ×2 IMPLANT
GLOVE BIOGEL PI INDICATOR 7.5 (GLOVE) ×2
GLOVE SURG SS PI 7.5 STRL IVOR (GLOVE) ×4 IMPLANT
GOWN STRL REUS W/ TWL LRG LVL3 (GOWN DISPOSABLE) ×4 IMPLANT
GOWN STRL REUS W/ TWL XL LVL3 (GOWN DISPOSABLE) ×4 IMPLANT
GOWN STRL REUS W/TWL LRG LVL3 (GOWN DISPOSABLE) ×4
GOWN STRL REUS W/TWL XL LVL3 (GOWN DISPOSABLE) ×4
GRAFT BALLN CATH 65CM (STENTS) ×2 IMPLANT
GUIDEWIRE ANGLED .035X150CM (WIRE) ×4 IMPLANT
KIT BASIN OR (CUSTOM PROCEDURE TRAY) ×4 IMPLANT
KIT TURNOVER KIT B (KITS) ×4 IMPLANT
LEG CONTRALATERAL 16X12X12 (Vascular Products) ×4 IMPLANT
LEG CONTRALATERAL 27X12 (Vascular Products) ×4 IMPLANT
NS IRRIG 1000ML POUR BTL (IV SOLUTION) ×4 IMPLANT
PACK ENDOVASCULAR (PACKS) ×4 IMPLANT
PAD ARMBOARD 7.5X6 YLW CONV (MISCELLANEOUS) ×8 IMPLANT
PENCIL BUTTON HOLSTER BLD 10FT (ELECTRODE) ×4 IMPLANT
PERCLOSE PROGLIDE 6F (VASCULAR PRODUCTS) ×16
SET MICROPUNCTURE 5F STIFF (MISCELLANEOUS) ×4 IMPLANT
SHEATH BRITE TIP 8FR 23CM (SHEATH) ×4 IMPLANT
SHEATH DRYSEAL FLEX 16FR 33CM (SHEATH) ×4 IMPLANT
SHEATH GUIDING 7F 55X73X9MM TD (SHEATH) ×4 IMPLANT
SHEATH PINNACLE 8F 10CM (SHEATH) ×4 IMPLANT
SHEATH PINNACLE ST 7F 45CM (SHEATH) ×4 IMPLANT
SPONGE GAUZE 2X2 STER 10/PKG (GAUZE/BANDAGES/DRESSINGS) ×4
STENT GRAFT BALLN CATH 65CM (STENTS) ×2
STOPCOCK MORSE 400PSI 3WAY (MISCELLANEOUS) ×4 IMPLANT
SUT PROLENE 5 0 C 1 24 (SUTURE) IMPLANT
SUT SILK 2 0 PERMA HAND 18 BK (SUTURE) ×4 IMPLANT
SUT VIC AB 2-0 CT1 27 (SUTURE)
SUT VIC AB 2-0 CT1 TAPERPNT 27 (SUTURE) IMPLANT
SUT VIC AB 3-0 SH 27 (SUTURE)
SUT VIC AB 3-0 SH 27X BRD (SUTURE) IMPLANT
SUT VICRYL 4-0 PS2 18IN ABS (SUTURE) IMPLANT
SYR 20ML LL LF (SYRINGE) ×4 IMPLANT
TOWEL GREEN STERILE (TOWEL DISPOSABLE) ×4 IMPLANT
TRAY FOLEY MTR SLVR 16FR STAT (SET/KITS/TRAYS/PACK) ×4 IMPLANT
TUBING HIGH PRESSURE 120CM (CONNECTOR) ×4 IMPLANT
WIRE AMPLATZ SS-J .035X180CM (WIRE) ×8 IMPLANT
WIRE BENTSON .035X145CM (WIRE) ×8 IMPLANT

## 2019-06-06 NOTE — Transfer of Care (Signed)
Immediate Anesthesia Transfer of Care Note  Patient: Lawrence Sandoval  Procedure(s) Performed: ABDOMINAL AORTIC ENDOVASCULAR STENT GRAFT COIL WITH RIGHT Hypogastric artery (N/A Abdomen) Ultrasound Guidance For Vascular Access, bilateral femoral arteries (Bilateral Groin)  Patient Location: PACU  Anesthesia Type:General  Level of Consciousness: awake and alert   Airway & Oxygen Therapy: Patient Spontanous Breathing and Patient connected to nasal cannula oxygen  Post-op Assessment: Report given to RN and Post -op Vital signs reviewed and stable  Post vital signs: Reviewed and stable  Last Vitals:  Vitals Value Taken Time  BP 150/86 06/06/19 1047  Temp    Pulse 76 06/06/19 1052  Resp 15 06/06/19 1052  SpO2 96 % 06/06/19 1052  Vitals shown include unvalidated device data.  Last Pain: There were no vitals filed for this visit.    Patients Stated Pain Goal: 5 (06/06/19 0604)  Complications: No apparent anesthesia complications

## 2019-06-06 NOTE — Anesthesia Postprocedure Evaluation (Signed)
Anesthesia Post Note  Patient: Lawrence Sandoval  Procedure(s) Performed: ABDOMINAL AORTIC ENDOVASCULAR STENT GRAFT COIL WITH RIGHT Hypogastric artery (N/A Abdomen) Ultrasound Guidance For Vascular Access, bilateral femoral arteries (Bilateral Groin)     Patient location during evaluation: PACU Anesthesia Type: General Level of consciousness: awake and alert Pain management: pain level controlled Vital Signs Assessment: post-procedure vital signs reviewed and stable Respiratory status: spontaneous breathing, nonlabored ventilation, respiratory function stable and patient connected to nasal cannula oxygen Cardiovascular status: blood pressure returned to baseline and stable Postop Assessment: no apparent nausea or vomiting Anesthetic complications: no    Last Vitals:  Vitals:   06/06/19 1345 06/06/19 1523  BP: 129/83 (!) 141/92  Pulse: 73 71  Resp: 17 14  Temp:    SpO2: 95% 95%    Last Pain:  Vitals:   06/06/19 1530  TempSrc:   PainSc: 0-No pain                 [name] P Keeara Frees

## 2019-06-06 NOTE — Interval H&P Note (Signed)
History and Physical Interval Note:  06/06/2019 7:24 AM  Lawrence Sandoval  has presented today for surgery, with the diagnosis of ABDOMINAL AORTIC ANEURYSM.  The various methods of treatment have been discussed with the patient and family. After consideration of risks, benefits and other options for treatment, the patient has consented to  Procedure(s): ABDOMINAL AORTIC ENDOVASCULAR STENT GRAFT COIL WITH RIGHT HYPO (N/A) as a surgical intervention.  The patient's history has been reviewed, patient examined, no change in status, stable for surgery.  I have reviewed the patient's chart and labs.  Questions were answered to the patient's satisfaction.     Durene Cal

## 2019-06-06 NOTE — Discharge Instructions (Signed)
  [facility]   Discharge Instructions  Endovascular Aortic Aneurysm Repair  Please refer to the following instructions for your post-procedure care. Your surgeon or Physician Assistant will discuss any changes with you.  Activity  You are encouraged to walk as much as you can. You can slowly return to normal activities but must avoid strenuous activity and heavy lifting until your doctor tells you it's OK. Avoid activities such as vacuuming or swinging a gold club. It is normal to feel tired for several weeks after your surgery. Do not drive until your doctor gives the OK and you are no longer taking prescription pain medications. It is also normal to have difficulty with sleep habits, eating, and bowel movements after surgery. These will go away with time.  Bathing/Showering  Shower daily after you go home.  Do not soak in a bathtub, hot tub, or swim until the incision heals completely.  If you have incisions in your groin, wash the groin wounds with soap and water daily and pat dry. (No tub bath-only shower)  Then put a dry gauze or washcloth there to keep this area dry to help prevent wound infection daily and as needed.  Do not use Vaseline or neosporin on your incisions.  Only use soap and water on your incisions and then protect and keep dry.  Incision Care  Shower every day. Clean your incision with mild soap and water. Pat the area dry with a clean towel. You do not need a bandage unless otherwise instructed. Do not apply any ointments or creams to your incision. If you clothing is irritating, you may cover your incision with a dry gauze pad.  Diet  Resume your normal diet. There are no special food restrictions following this procedure. A low fat/low cholesterol diet is recommended for all patients with vascular disease. In order to heal from your surgery, it is CRITICAL to get adequate nutrition. Your body requires vitamins, minerals, and protein.  Vegetables are the best source of vitamins and minerals. Vegetables also provide the perfect balance of protein. Processed food has little nutritional value, so try to avoid this.  Medications  Resume taking all of your medications unless your doctor or nurse practitioner tells you not to. If your incision is causing pain, you may take over-the-counter pain relievers such as acetaminophen (Tylenol). If you were prescribed a stronger pain medication, please be aware these medications can cause nausea and constipation. Prevent nausea by taking the medication with a snack or meal. Avoid constipation by drinking plenty of fluids and eating foods with a high amount of fiber, such as fruits, vegetables, and grains.  Do not take Tylenol if you are taking prescription pain medications.   Follow up  Our office will schedule a follow-up appointment with a CT scan 3-4 weeks after your surgery.  Please call us immediately for any of the following conditions  Severe or worsening pain in your legs or feet or in your abdomen back or chest. Increased pain, redness, drainage (pus) from your incision site. Increased abdominal pain, bloating, nausea, vomiting or persistent diarrhea. Fever of 101 degrees or higher. Swelling in your leg (s),  Reduce your risk of vascular disease  Stop smoking. If you would like help call [facility] at 1-800-QUIT-NOW (1-800-784-8669) or [facility] at 336-586-4000. Manage your cholesterol Maintain a desired weight Control your diabetes Keep your blood pressure down  If you have questions, please call the office at [phone].  

## 2019-06-06 NOTE — Anesthesia Procedure Notes (Signed)
Procedure Name: Intubation Date/Time: 06/06/2019 7:41 AM Performed by: Elliot Dally, CRNA Pre-anesthesia Checklist: Patient identified, Emergency Drugs available, Suction available and Patient being monitored Patient Re-evaluated:Patient Re-evaluated prior to induction Oxygen Delivery Method: Circle System Utilized Preoxygenation: Pre-oxygenation with 100% oxygen Induction Type: IV induction Ventilation: Mask ventilation without difficulty Laryngoscope Size: Miller and 3 Grade View: Grade I Tube type: Oral Tube size: 7.5 mm Number of attempts: 1 Airway Equipment and Method: Stylet and Oral airway Placement Confirmation: ETT inserted through vocal cords under direct vision,  positive ETCO2 and breath sounds checked- equal and bilateral Secured at: 22 cm Tube secured with: Tape Dental Injury: Teeth and Oropharynx as per pre-operative assessment

## 2019-06-06 NOTE — Progress Notes (Signed)
  Day of Surgery Note    Subjective:  Says he was dizzy when he woke up but coming around now and doing fine with no complaints.    Vitals:   06/06/19 0611  BP: (!) 166/109  Pulse: 73  Resp: 18  Temp: 98.4 F (36.9 C)  SpO2: 100%    Incisions:   Bilateral groins are soft without hematoma Extremities:  Palpable DP pulses bilaterally Cardiac:  regular Lungs:  Non labored Abdomen:  Soft, NT/ND   Assessment/Plan:  This is a 64 y.o. male who is s/p  EVAR  -pt doing well in recovery with palpable pedal pulses and abdomen soft.  Bilateral groins soft without hematoma -to 4 east later this afternoon -anticipate dc tomorrow if post op course uneventful   Doreatha Massed, PA-C 06/06/2019 11:45 AM [phone]

## 2019-06-06 NOTE — Progress Notes (Signed)
Foley leaking and dislodged. Foley removed.  Ginette Otto, RN

## 2019-06-06 NOTE — Progress Notes (Signed)
Patient to room 4E14 from PACU. Vital signs obtained. Monitor on CCMD notified. Alert and oriented to room and call light. Call bell within reach.  Ginette Otto, RN

## 2019-06-06 NOTE — Op Note (Signed)
Patient name: Lawrence Sandoval MRN: [id number] DOB: 1955/04/16 Sex: male  06/06/2019 Pre-operative Diagnosis: Abdominal aortic aneurysm Post-operative diagnosis:  Same Surgeon:  Annamarie Major Assistants: Laurence Slate Procedure:   #1: Endovascular repair of infrarenal abdominal aortic aneurysm   #2: Bilateral ultrasound-guided common femoral artery access   #3: Selective catheterization of the right internal iliac artery from the left common femoral artery with pelvic angiogram   #4: Embolization of the right internal iliac artery Anesthesia: General Blood Loss: 100 cc Specimens: None  Findings: Complete exclusion  Devices used: Main body was primary right conformable Gore excluder 23 x 14 x 12.  Ipsilateral right 12 x 12.  Contralateral limb was a Gore 27 x 12.  Embolization was performed with interlock coils (15 x 25, 15 x 40, 10 x 20  Indications: I have been following the patient for progressively enlarging abdominal aortic aneurysm and left common iliac aneurysm.  Maximum aortic diameter was 5 cm.  The iliac on the left measured 2.6 cm.  The patient comes in today for repair.  Procedure:  The patient was identified in the holding area and taken to Lawrence Sandoval 16  The patient was then placed supine on the table. general anesthesia was administered.  The patient was prepped and draped in the usual sterile fashion.  A time out was called and antibiotics were administered.  Ultrasound was used to evaluate bilateral common femoral arteries which were widely patent.  A digital ultrasound image was acquired.  A #11 blade was used to make a skin nick bilaterally.  Under ultrasound guidance, bilateral common femoral arteries were cannulated under ultrasound guidance with a micropuncture needle.  An 018 wire was advanced without resistance and a micropuncture sheath was placed.  Bentson wires were then inserted and the subcutaneous tract was dilated with an 8 [place] dilator.  Pro-glide devices were  deployed at the 11:00 and 1 o'clock position for preclosure.  An 8 French sheath was placed on the right and a 7 x 45 cm Lawrence Sandoval sheath was placed on the left.  The patient was fully heparinized.  Using the Omni Flush cath and a Lawrence Sandoval, the aortic bifurcation was crossed from the left and the right hypogastric artery was selected.  A Lawrence Sandoval 2 catheter was then advanced into the right hypogastric artery followed by the sheath.  Pelvic angiogram was performed locating the primary internal iliac branches.  I then proceeded with coil embolization.  I first deployed a interlock 15 x 40 followed by 15 x 25 and a 10 x 20.  Follow-up imaging revealed successful embolization.  The 7 French sheath was then removed and a 16 French sheath was advanced into the aorta over an Lawrence Sandoval wire under fluoroscopic visualization.  Similarly on the right over a Lawrence Sandoval advantage, a 71 French sheath was inserted into the aorta.  The main body device was prepared on the back table.  This was a conformable Gore excluder 23 x 14 x 12.  An abdominal aortogram was performed from the catheter on the left side locating the renal arteries.  The main body device was then deployed landing at the level of the lowest left renal artery.  I had difficulty cannulating the gate and so the device had to be rotated several times.  Ultimately I was able to cannulate the gate using a Lawrence Sandoval catheter and a Lawrence Sandoval.  A Omni Flush catheter was able be freely rotated within the main body, confirming successful cannulation.  Next the image  detector was rotated to a right anterior oblique position and a retrograde injection was performed through the sheath locating the origin of the left hypogastric artery.  The ipsilateral limb was prepared on the back table.  This was a Gore 27 x 12 device.  It was then deployed landing at the level of the left hypogastric artery.  Next the remaining portion of the ipsilateral limb was deployed.  A retrograde injection  performed through the sheath in the right groin located the landing zone of the right leg extension.  A 12 x 12 limb was deployed.  Next, a MLB balloon was used to mold the proximal and distal attachment sites as well as device overlap.  A completion arteriogram was performed which showed exclusion of the aneurysm sac with preserved patency of the left hypogastric artery, bilateral renal arteries.  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  I was satisfied with these results.  Bentson wires were then inserted bilaterally and the 16 French sheaths were removed and the Pro-glide devices were used to close the arteriotomy.  50 mg of protamine was then administered.  Cautery was used on the subcutaneous tissue followed by Dermabond.  The patient had brisk Doppler signals in both feet.  He was successfully extubated and taken to recovery in stable condition.  There were no immediate complications.   Disposition: To PACU stable.   Juleen China, M.D., [facility] [place] Office: [phone] Pager:  [phone]

## 2019-06-07 LAB — BASIC METABOLIC PANEL
Anion gap: 10 (ref 5–15)
BUN: 13 mg/dL (ref 8–23)
CO2: 23 mmol/L (ref 22–32)
Calcium: 9 mg/dL (ref 8.9–10.3)
Chloride: 104 mmol/L (ref 98–111)
Creatinine, Ser: 1.03 mg/dL (ref 0.61–1.24)
GFR calc Af Amer: >60 mL/min (ref >60)
GFR calc non Af Amer: >60 mL/min (ref >60)
Glucose, Bld: 123 mg/dL — ABNORMAL HIGH (ref 70–99)
Potassium: 4.4 mmol/L (ref 3.5–5.1)
Sodium: 137 mmol/L (ref 135–145)

## 2019-06-07 LAB — CBC
HCT: 40.2 % (ref 39.0–52.0)
Hemoglobin: 13.4 g/dL (ref 13.0–17.0)
MCH: 28.9 pg (ref 26.0–34.0)
MCHC: 33.3 g/dL (ref 30.0–36.0)
MCV: 86.6 fL (ref 80.0–100.0)
Platelets: 243 10*3/uL (ref 150–400)
RBC: 4.64 MIL/uL (ref 4.22–5.81)
RDW: 14.6 % (ref 11.5–15.5)
WBC: 12.3 10*3/uL — ABNORMAL HIGH (ref 4.0–10.5)
nRBC: 0 % (ref 0.0–0.2)

## 2019-06-07 MED ORDER — OXYCODONE-ACETAMINOPHEN 5-325 MG PO TABS: 1.0000 | ORAL_TABLET | Freq: Four times a day (QID) | ORAL | 0 refills | Status: DC | PRN

## 2019-06-07 MED ORDER — ASPIRIN EC 81 MG PO TBEC: 81.0000 mg | DELAYED_RELEASE_TABLET | Freq: Every day | ORAL | Status: AC

## 2019-06-07 NOTE — Progress Notes (Signed)
Patient walked 940 ft. No issues. Denies SOB. Some soreness at groin site.

## 2019-06-07 NOTE — Plan of Care (Signed)
DISCHARGE NOTE HOME Raylin Winer to be discharged home per MD order. Discussed prescriptions and follow up appointments with the patient. Prescriptions given to patient; medication list explained in detail. Patient verbalized understanding.  Skin clean, dry and intact without evidence of skin break down, no evidence of skin tears noted. IV catheter discontinued intact. Site without signs and symptoms of complications. Dressing and pressure applied. Pt denies pain at the site currently. No complaints noted.  Patient free of lines, drains, and wounds.   An After Visit Summary (AVS) was printed and given to the patient. Patient escorted via wheelchair, and discharged home via private auto.  Arlice Colt, RN

## 2019-06-07 NOTE — Plan of Care (Signed)
  Problem: Health Behavior/Discharge Planning: Goal: Ability to manage health-related needs will improve Outcome: Progressing   Problem: Clinical Measurements: Goal: Respiratory complications will improve Outcome: Progressing Goal: Cardiovascular complication will be avoided Outcome: Progressing   Problem: Activity: Goal: Risk for activity intolerance will decrease Outcome: Progressing   

## 2019-06-07 NOTE — Progress Notes (Addendum)
[facility] [place]  Subjective  - Doing well without new complaints other than mild groin pain at stick sites.   Objective 116/80 97 98.1 F (36.7 C) (Oral) 12 94%  Intake/Output Summary (Last 24 hours) at 06/07/2019 0755 Last data filed at 06/07/2019 0303 Gross per 24 hour  Intake 2140 ml  Output 925 ml  Net 1215 ml    Palpable DP pulses B B groins soft without hematoma Heart RRR Lungs non labored breathing  Assessment/Planning: POD # 1 EVAR and Coil Embolization of the right internal iliac artery His disposition is stable for discharge home.  He has ambulated and voided.  Tolerating PO's well He will f/u in our office in 4 weeks with a repeat CTA Abd/pelvis    Mosetta Pigeon 06/07/2019 7:55 AM --  Laboratory Lab Results: Recent Labs    06/06/19 1448 06/07/19 0450  WBC 9.1 12.3*  HGB 14.3 13.4  HCT 43.5 40.2  PLT 235 243   BMET Recent Labs    06/06/19 1448 06/07/19 0450  NA 138 137  K 4.3 4.4  CL 105 104  CO2 22 23  GLUCOSE 131* 123*  BUN 8 13  CREATININE 0.92 1.03  CALCIUM 9.2 9.0    COAG Lab Results  Component Value Date   INR 0.9 06/06/2019   INR 0.9 06/04/2019   No results found for: PTT  I have seen and evaluated the patient. I agree with the PA note as documented above.  Postop day 1 status post endovascular repair of infrarenal abdominal aortic aneurysm with embolization of the right internal iliac artery.  Patient is doing well this morning and having no significant abdominal or pelvic pain.  Groins look clean and dry.  He has palpable pedal pulses bilaterally.  Plan for discharge today.  Will arrange follow-up in 1 month.  Cephus Shelling, MD [facility] [place] Office: [phone]

## 2019-06-09 ENCOUNTER — Encounter: Payer: Self-pay | Admitting: *Deleted

## 2019-06-09 ENCOUNTER — Other Ambulatory Visit: Payer: Self-pay

## 2019-06-09 DIAGNOSIS — I714 Abdominal aortic aneurysm, without rupture, unspecified: Secondary | ICD-10-CM

## 2019-06-09 NOTE — Discharge Summary (Signed)
[facility] Discharge Summary   Patient ID:  Lawrence Sandoval MRN: [id number] DOB/AGE: [birth year] 62 y.o.  Admit date: 06/06/2019 Discharge date: 06/07/19 Date of Surgery: 06/06/2019 Surgeon: Surgeon(s): Serafina Mitchell, MD  Admission Diagnosis: AAA (abdominal aortic aneurysm) [facility]) [I71.4]  Discharge Diagnoses:  AAA (abdominal aortic aneurysm) ([place]) [I71.4]  Secondary Diagnoses: Past Medical History:  Diagnosis Date  . AAA (abdominal aortic aneurysm) ([place])   . Herniated lumbar intervertebral disc   . Hyperlipidemia   . Hypertension   . Hyperthyroidism   . Lumbago   . Sleep apnea     Procedure(s): ABDOMINAL AORTIC ENDOVASCULAR STENT GRAFT COIL WITH RIGHT Hypogastric artery Ultrasound Guidance For Vascular Access, bilateral femoral arteries  Discharged Condition: good  HPI: Lawrence Sandoval is a 64 y.o. male that I have been following for a infrarenal abdominal aortic aneurysm.  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  He was scheduled for EVAR repair of his AAA and coil for the iliac aneurysm.     Hospital Course:  Lawrence Sandoval is a 64 y.o. male is S/P  Procedure(s): ABDOMINAL AORTIC ENDOVASCULAR STENT GRAFT COIL WITH RIGHT Hypogastric artery Ultrasound Guidance For Vascular Access, bilateral femoral arteries  Post op day 1 he had palpable DP pulses B with good inflow, B groins soft without hematoma and ambulatory.  He will f/u in our office in 4 weeks with a repeat CTA Abd/pelvis.     Significant Diagnostic Studies: CBC Lab Results  Component Value Date   WBC 12.3 (H) 06/07/2019   HGB 13.4 06/07/2019   HCT 40.2 06/07/2019   MCV 86.6 06/07/2019   PLT 243 06/07/2019    BMET    Component Value Date/Time   NA 137 06/07/2019 0450   K 4.4 06/07/2019 0450   CL 104 06/07/2019 0450   CO2 23 06/07/2019 0450   GLUCOSE 123 (H) 06/07/2019 0450    BUN 13 06/07/2019 0450   CREATININE 1.03 06/07/2019 0450   CALCIUM 9.0 06/07/2019 0450   GFRNONAA >60 06/07/2019 0450   GFRAA >60 06/07/2019 0450   COAG Lab Results  Component Value Date   INR 0.9 06/06/2019   INR 0.9 06/04/2019     Disposition:  Discharge to :Home Discharge Instructions    Call MD for:  redness, tenderness, or signs of infection (pain, swelling, bleeding, redness, odor or green/yellow discharge around incision site)   Complete by: As directed    Call MD for:  severe or increased pain, loss or decreased feeling  in affected limb(s)   Complete by: As directed    Call MD for:  temperature >100.5   Complete by: As directed    Discharge instructions   Complete by: As directed    You may remove groin dressing and shower as needed tomorrow 06/08/19.  Place bandaide over groin wounds until healed.   Resume previous diet   Complete by: As directed      Allergies as of 06/07/2019   No Known Allergies     Medication List    TAKE these medications   acetaminophen 500 MG tablet Commonly known as: TYLENOL Take 1,000 mg by mouth every 6 (six) hours as needed for mild pain.   aspirin EC 81 MG tablet Take 1 tablet (81 mg total) by mouth daily.   cetirizine 10 MG tablet Commonly known as: ZYRTEC Take 10 mg by mouth daily  as needed for rhinitis.   diltiazem 180 MG 24 hr capsule Commonly known as: CARDIZEM CD Take 180 mg by mouth daily.   methimazole 5 MG tablet Commonly known as: TAPAZOLE Take 5 mg by mouth daily.   NYQUIL MULTI-SYMPTOM PO Take 30 mLs by mouth at bedtime as needed (Cold).   omeprazole 40 MG capsule Commonly known as: PRILOSEC Take 40 mg by mouth daily.   oxyCODONE-acetaminophen 5-325 MG tablet Commonly known as: Percocet Take 1 tablet by mouth every 6 (six) hours as needed.   pravastatin 40 MG tablet Commonly known as: PRAVACHOL Take 40 mg by mouth at bedtime.      Verbal and written Discharge instructions given to the patient.  Wound care per Discharge AVS Follow-up Information    Nada Libman, MD In 4 weeks.   Specialties: Vascular Surgery, Cardiology Why: Office will call you to arrange your appt (sent) Contact information: [address] [place] [address] [phone]           Signed: Mosetta Pigeon 06/09/2019, 8:19 AM - For VQI Registry use --- Instructions: Press F2 to tab through selections.  Delete question if not applicable.   Post-op:  Time to Extubation: [x ] In OR, [ ]  < 12 hrs, [ ]  12-24 hrs, [ ]  >=24 hrs Vasopressors Req. Post-op: No MI: [ ]  No, [ ]  Troponin only, [ ]  EKG or Clinical New Arrhythmia: No CHF: No ICU Stay: 0 days Transfusion: No  If yes, 0 units given  Complications: Resp failure: [x ] none, [ ]  Pneumonia, [ ]  Ventilator Chg in renal function: [ x] none, [ ]  Inc. Cr > 0.5, [ ]  Temp. Dialysis, [ ]  Permanent dialysis Leg ischemia: [x ] No, [ ]  Yes, no Surgery needed, [ ]  Yes, Surgery needed, [ ]  Amputation Bowel ischemia: [x ] No, [ ]  Medical Rx, [ ]  Surgical Rx Wound complication: [ ]  No, [ ]  Superficial separation/infection, [ ]  Return to OR Return to OR: No  Return to OR for bleeding: No Stroke: [ ]  None, [ ]  Minor, [ ]  Major  Discharge medications: Statin use:  Yes ASA use:  Yes Plavix use:  No  for medical reason   Beta blocker use:  No  for medical reason

## 2019-06-14 LAB — POCT ACTIVATED CLOTTING TIME
Activated Clotting Time: 213 seconds
Activated Clotting Time: 235 seconds
Activated Clotting Time: 213 seconds

## 2019-07-01 ENCOUNTER — Inpatient Hospital Stay: Admission: RE | Admit: 2019-07-01 | Payer: Medicare Other | Source: Ambulatory Visit

## 2019-07-01 ENCOUNTER — Other Ambulatory Visit: Payer: Self-pay | Admitting: Surgery

## 2019-07-01 DIAGNOSIS — I714 Abdominal aortic aneurysm, without rupture, unspecified: Secondary | ICD-10-CM

## 2019-07-02 ENCOUNTER — Ambulatory Visit
Admission: RE | Admit: 2019-07-02 | Discharge: 2019-07-02 | Disposition: A | Discharge status: Home / Self Care | Payer: Medicare Other | Source: Ambulatory Visit | Attending: Surgery | Admitting: Surgery

## 2019-07-02 ENCOUNTER — Other Ambulatory Visit: Payer: Self-pay | Admitting: Surgery

## 2019-07-02 DIAGNOSIS — I714 Abdominal aortic aneurysm, without rupture, unspecified: Secondary | ICD-10-CM

## 2019-07-02 HISTORY — PX: IR US GUIDE VASC ACCESS RIGHT: IMG2390

## 2019-07-02 MED ORDER — IOPAMIDOL (ISOVUE-370) INJECTION 76%
75.0000 mL | Freq: Once | INTRAVENOUS | Status: AC | PRN
  Administered 2019-07-02: 75 mL via INTRAVENOUS

## 2019-07-04 ENCOUNTER — Telehealth (HOSPITAL_COMMUNITY): Payer: Self-pay

## 2019-07-04 NOTE — Telephone Encounter (Signed)

## 2019-07-07 ENCOUNTER — Other Ambulatory Visit: Payer: Self-pay

## 2019-07-07 ENCOUNTER — Ambulatory Visit (INDEPENDENT_AMBULATORY_CARE_PROVIDER_SITE_OTHER): Payer: Self-pay | Admitting: Surgery

## 2019-07-07 ENCOUNTER — Encounter: Payer: Self-pay | Admitting: Surgery

## 2019-07-07 VITALS — BP 137/87 | HR 74 | Temp 97.4°F | Resp 20 | Ht 72.0 in | Wt 208.6 lb

## 2019-07-07 DIAGNOSIS — I714 Abdominal aortic aneurysm, without rupture, unspecified: Secondary | ICD-10-CM

## 2019-07-07 NOTE — Progress Notes (Signed)
   Patient name: Lawrence Sandoval MRN: [id number] DOB: [birth year] Sex: male  REASON FOR VISIT:    Postop  HISTORY OF PRESENT ILLNESS:   Lawrence Sandoval is a 64 y.o. 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  He also had coil embolization of the right internal iliac artery.  He has had no complications following surgery.  He continues to take a statin for hypercholesterolemia.  He is medically managed for hypertension.  He is a former smoker.  He has been seen and evaluated by cardiology and cleared to proceed with surgery.  CURRENT MEDICATIONS:    Current Outpatient Medications  Medication Sig Dispense Refill  . acetaminophen (TYLENOL) 500 MG tablet Take 1,000 mg by mouth every 6 (six) hours as needed for mild pain.    Marland Kitchen aspirin EC 81 MG tablet Take 1 tablet (81 mg total) by mouth daily.    Marland Kitchen diltiazem (CARDIZEM CD) 360 MG 24 hr capsule Take 360 mg by mouth daily.    . methimazole (TAPAZOLE) 5 MG tablet Take 5 mg by mouth daily.     Marland Kitchen omeprazole (PRILOSEC) 40 MG capsule Take 40 mg by mouth daily.     . pravastatin (PRAVACHOL) 40 MG tablet Take 40 mg by mouth at bedtime.     . Pseudoeph-Doxylamine-DM-APAP (NYQUIL MULTI-SYMPTOM PO) Take 30 mLs by mouth at bedtime as needed (Cold).    . cetirizine (ZYRTEC) 10 MG tablet Take 10 mg by mouth daily as needed for rhinitis.      No current facility-administered medications for this visit.    REVIEW OF SYSTEMS:   [X]  denotes positive finding, [ ]  denotes negative finding Cardiac  Comments:  Chest pain or chest pressure:    Shortness of breath upon exertion:    Short of breath when lying flat:    Irregular heart rhythm:    Constitutional    Fever or chills:      PHYSICAL EXAM:   Vitals:   07/07/19 1445  BP: 137/87  Pulse: 74  Resp: 20  Temp: (!) 97.4 F (36.3 C)  SpO2: 96%  Weight: 208 lb 9.6 oz (94.6 kg)   Height: 6' (1.829 m)    GENERAL: The patient is a well-nourished male, in no acute distress. The vital signs are documented above. CARDIOVASCULAR: There is a regular rate and rhythm. PULMONARY: Non-labored respirations   STUDIES:   CTA:  Interval EVAR for repair of infrarenal abdominal aortic aneurysm, with right hypogastric artery coiling. A type 2 endoleak is present via the lumbar arteries on this initial follow-up, with no change in the size of the excluded aneurysm sac.  MEDICAL ISSUES:   AAA: CT scan shows that the stent graft is in good position.  There is a type II endoleak.  There has been no change in the maximum diameter of the aneurysm which is 5 cm.  I will have him follow-up in 6 months with an ultrasound.  I will need to monitor his left common iliac artery distal to the stent.  He has no symptoms from hypogastric embolization.  07/09/19, MD, FACS [facility] [phone] Pager [phone]

## 2019-11-28 DIAGNOSIS — E119 Type 2 diabetes mellitus without complications: Secondary | ICD-10-CM | POA: Insufficient documentation

## 2020-02-05 ENCOUNTER — Other Ambulatory Visit: Payer: Self-pay | Admitting: *Deleted

## 2020-02-05 DIAGNOSIS — I714 Abdominal aortic aneurysm, without rupture, unspecified: Secondary | ICD-10-CM

## 2020-02-23 ENCOUNTER — Ambulatory Visit (HOSPITAL_COMMUNITY)
Admission: RE | Admit: 2020-02-23 | Discharge: 2020-02-23 | Disposition: A | Discharge status: Home / Self Care | Payer: Medicare Other | Source: Ambulatory Visit | Attending: Surgery | Admitting: Surgery

## 2020-02-23 ENCOUNTER — Other Ambulatory Visit: Payer: Self-pay | Admitting: Surgery

## 2020-02-23 ENCOUNTER — Encounter: Payer: Self-pay | Admitting: Surgery

## 2020-02-23 ENCOUNTER — Other Ambulatory Visit: Payer: Self-pay

## 2020-02-23 ENCOUNTER — Ambulatory Visit (INDEPENDENT_AMBULATORY_CARE_PROVIDER_SITE_OTHER): Payer: Medicare Other | Admitting: Surgery

## 2020-02-23 VITALS — BP 141/90 | HR 64 | Temp 97.5°F | Resp 20 | Ht 72.0 in | Wt 217.0 lb

## 2020-02-23 DIAGNOSIS — I714 Abdominal aortic aneurysm, without rupture, unspecified: Secondary | ICD-10-CM

## 2020-02-23 NOTE — Progress Notes (Signed)
[facility]  Patient name: Lawrence Sandoval MRN: [id number] DOB: [birth year] Sex: male   REASON FOR VISIT:    Follow up  HISOTRY OF PRESENT ILLNESS:    Lawrence Sandoval is a 65 y.o. 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  He also had coil embolization of the right internal iliac artery.  He has had no complications following surgery.  He denies any abdominal or back pain.  He continues to take a statin for hypercholesterolemia. He is medically managed for hypertension. He is a former smoker. He has been seen and evaluated by cardiology and cleared to proceed with surgery.   PAST MEDICAL HISTORY:   Past Medical History:  Diagnosis Date  . AAA (abdominal aortic aneurysm) ([facility])   . Herniated lumbar intervertebral disc   . Hyperlipidemia   . Hypertension   . Hyperthyroidism   . Lumbago   . Sleep apnea      FAMILY HISTORY:   Family History  Problem Relation Age of Onset  . Heart disease Mother        CHF  . Alcohol abuse Father   . Hypertension Father   . Stroke Maternal Grandfather     SOCIAL HISTORY:   Social History   Tobacco Use  . Smoking status: Former Smoker    Years: 35.00    Types: Cigarettes    Quit date: 02/18/2003    Years since quitting: 17.0  . Smokeless tobacco: Never Used  Substance Use Topics  . Alcohol use: No    Comment: quit abusing alcohol about 10 years ago     ALLERGIES:   No Known Allergies   CURRENT MEDICATIONS:   Current Outpatient Medications  Medication Sig Dispense Refill  . acetaminophen (TYLENOL) 500 MG tablet Take 1,000 mg by mouth every 6 (six) hours as needed for mild pain.    Marland Kitchen aspirin EC 81 MG tablet Take 1 tablet (81 mg total) by mouth daily.    . cetirizine (ZYRTEC) 10 MG tablet Take 10 mg by mouth daily as needed for rhinitis.     Marland Kitchen diltiazem (CARDIZEM  CD) 360 MG 24 hr capsule Take 360 mg by mouth daily.    . methimazole (TAPAZOLE) 5 MG tablet Take 5 mg by mouth daily.     Marland Kitchen omeprazole (PRILOSEC) 40 MG capsule Take 40 mg by mouth daily.     . pravastatin (PRAVACHOL) 40 MG tablet Take 40 mg by mouth at bedtime.     . Pseudoeph-Doxylamine-DM-APAP (NYQUIL MULTI-SYMPTOM PO) Take 30 mLs by mouth at bedtime as needed (Cold).     No current facility-administered medications for this visit.    REVIEW OF SYSTEMS:   [X]  denotes positive finding, [ ]  denotes negative finding Cardiac  Comments:  Chest pain or chest pressure:    Shortness of breath upon exertion:    Short of breath when lying flat:    Irregular heart rhythm:        Vascular    Pain in calf, thigh, or hip brought on by ambulation:    Pain in feet at night that wakes you up from your sleep:     Blood clot in your veins:    Leg swelling:         Pulmonary    Oxygen at home:    Productive cough:     Wheezing:  Neurologic    Sudden weakness in arms or legs:     Sudden numbness in arms or legs:     Sudden onset of difficulty speaking or slurred speech:    Temporary loss of vision in one eye:     Problems with dizziness:         Gastrointestinal    Blood in stool:     Vomited blood:         Genitourinary    Burning when urinating:     Blood in urine:        Psychiatric    Major depression:         Hematologic    Bleeding problems:    Problems with blood clotting too easily:        Skin    Rashes or ulcers:        Constitutional    Fever or chills:      PHYSICAL EXAM:   There were no vitals filed for this visit.  GENERAL: The patient is a well-nourished male, in no acute distress. The vital signs are documented above. CARDIAC: There is a regular rate and rhythm.  VASCULAR: non-palpable pedal pulses PULMONARY: Non-labored respirations ABDOMEN: Soft and non-tender with normal pitched bowel sounds.  MUSCULOSKELETAL: There are no major deformities  or cyanosis. NEUROLOGIC: No focal weakness or paresthesias are detected. SKIN: There are no ulcers or rashes noted. PSYCHIATRIC: The patient has a normal affect.  STUDIES:   I have reviewed his u/s with the following findings: Abdominal Aorta: Patent endovascular aneurysm repair with no evidence of  endoleak.  Maximum diameter:  4.8cm  MEDICAL ISSUES:   S/p EVAR for a 5cm AAA.   Maximum diameter today is 4.9 cm with no evidence of endoleak.  I will plan for follow up duplex in 1 year    Durene Cal, IV, MD, FACS [facility] [phone] Pager [phone]

## 2020-05-07 ENCOUNTER — Other Ambulatory Visit: Payer: Self-pay

## 2020-05-07 ENCOUNTER — Encounter: Payer: Self-pay | Admitting: Cardiology

## 2020-05-07 ENCOUNTER — Ambulatory Visit (INDEPENDENT_AMBULATORY_CARE_PROVIDER_SITE_OTHER): Payer: Medicare Other | Admitting: Cardiology

## 2020-05-07 VITALS — BP 138/88 | HR 72 | Ht 72.0 in | Wt 218.0 lb

## 2020-05-07 DIAGNOSIS — Z8679 Personal history of other diseases of the circulatory system: Secondary | ICD-10-CM

## 2020-05-07 DIAGNOSIS — E785 Hyperlipidemia, unspecified: Secondary | ICD-10-CM | POA: Diagnosis not present

## 2020-05-07 DIAGNOSIS — I714 Abdominal aortic aneurysm, without rupture, unspecified: Secondary | ICD-10-CM

## 2020-05-07 DIAGNOSIS — I10 Essential (primary) hypertension: Secondary | ICD-10-CM | POA: Diagnosis not present

## 2020-05-07 DIAGNOSIS — Z9889 Other specified postprocedural states: Secondary | ICD-10-CM | POA: Diagnosis not present

## 2020-05-07 DIAGNOSIS — Z7189 Other specified counseling: Secondary | ICD-10-CM

## 2020-05-07 NOTE — Patient Instructions (Addendum)
Medication Instructions:  Your Physician recommend you continue on your current medication as directed.    *If you need a refill on your cardiac medications before your next appointment, please call your pharmacy*   Lab Work: None   Testing/Procedures: None   Follow-Up: At [facility], you and your health needs are our priority.  As part of our continuing mission to provide you with exceptional heart care, we have created designated Provider Care Teams.  These Care Teams include your primary Cardiologist (physician) and Advanced Practice Providers (APPs -  Physician Assistants and Nurse Practitioners) who all work together to provide you with the care you need, when you need it.  We recommend signing up for the patient portal called "MyChart".  Sign up information is provided on this After Visit Summary.  MyChart is used to connect with patients for Virtual Visits (Telemedicine).  Patients are able to view lab/test results, encounter notes, upcoming appointments, etc.  Non-urgent messages can be sent to your provider as well.   To learn more about what you can do with MyChart, go to [email or web address].    Your next appointment:   1 year(s) @ [address] [place], [place] [address]  The format for your next appointment:   In Person  Provider:   Bridgette Christopher, MD   

## 2020-05-07 NOTE — Progress Notes (Signed)
Cardiology Office Note:    Date:  05/07/2020   ID:  Lawrence Sandoval, DOB Feb 08, 1956, MRN [id number]  PCP:  Wynelle Fanny, DO  Cardiologist:  Buford Dresser, MD  Referring MD: Wynelle Fanny, DO   CC: follow up  History of Present Illness:    Lawrence Sandoval is a 65 y.o. male with a hx of AAA, hypertension, hypercholesterolemia who is seen for follow up today. I initially met him 05/08/19 as a new consult at the request of Wynelle Fanny, DO for the evaluation and management of preoperative cardiovascular risk.  Pertinent past cardiac history: hypertension, hyperlipidemia, AAA OSA on CPAP Quit smoking and drinking 10-12 years ago.  Today: Doing well overall. Follows with Dr. Wynn Banker at [facility]. Has appt next week with his PCP, planned for fasting blood work at that time.   Reviewed medications today. Tolerating aspirin, no bleeding issues. Checks rare BP at home. Has seen 120s/80s. Has not seen high numbers. No longer on atenolol, but is taking diltiazem. Cannot recall if he has had palpitations/fast rhythms in the past.  Denies chest pain, shortness of breath at rest or with normal exertion. No PND, orthopnea, LE edema or unexpected weight gain. No syncope or palpitations. Suffering from allergies today.  Past Medical History:  Diagnosis Date   AAA (abdominal aortic aneurysm) ([place])    Herniated lumbar intervertebral disc    Hyperlipidemia    Hypertension    Hyperthyroidism    Lumbago    Sleep apnea     Past Surgical History:  Procedure Laterality Date   ABDOMINAL AORTIC ENDOVASCULAR STENT GRAFT N/A 06/06/2019   Procedure: ABDOMINAL AORTIC ENDOVASCULAR STENT GRAFT COIL WITH RIGHT Hypogastric artery;  Surgeon: Serafina Mitchell, MD;  Location: [facility] OR;  Service: Vascular;  Laterality: N/A;   HAND SURGERY Left 1995   Hand and Arm injury.    HERNIA REPAIR     IR US GUIDE VASC ACCESS RIGHT  04/03/2019   IR US GUIDE VASC ACCESS RIGHT  07/02/2019   ULTRASOUND GUIDANCE FOR VASCULAR  ACCESS Bilateral 06/06/2019   Procedure: Ultrasound Guidance For Vascular Access, bilateral femoral arteries;  Surgeon: Serafina Mitchell, MD;  Location: [facility] OR;  Service: Vascular;  Laterality: Bilateral;    Current Medications: Current Outpatient Medications on File Prior to Visit  Medication Sig   acetaminophen (TYLENOL) 500 MG tablet Take 1,000 mg by mouth every 6 (six) hours as needed for mild pain.   aspirin EC 81 MG tablet Take 1 tablet (81 mg total) by mouth daily.   diltiazem (CARDIZEM CD) 360 MG 24 hr capsule Take 360 mg by mouth daily.   Loratadine-Pseudoephedrine (CLARITIN-D 24 HOUR PO) Take by mouth as needed.   methimazole (TAPAZOLE) 5 MG tablet Take 5 mg by mouth every other day.   omeprazole (PRILOSEC) 40 MG capsule Take 40 mg by mouth daily.    pravastatin (PRAVACHOL) 40 MG tablet Take 40 mg by mouth at bedtime.    Pseudoeph-Doxylamine-DM-APAP (NYQUIL MULTI-SYMPTOM PO) Take 30 mLs by mouth at bedtime as needed (Cold).   No current facility-administered medications on file prior to visit.     Allergies:   Patient has no known allergies.   Social History   Tobacco Use   Smoking status: Former Smoker    Years: 35.00    Types: Cigarettes    Quit date: 02/18/2003    Years since quitting: 17.2   Smokeless tobacco: Never Used  Vaping Use   Vaping Use: Never used  Substance Use  Topics   Alcohol use: No    Comment: quit abusing alcohol about 10 years ago   Drug use: No    Family History: family history includes Alcohol abuse in his father; Heart disease in his mother; Hypertension in his father; Stroke in his maternal grandfather.  ROS:   Please see the history of present illness.  Additional pertinent ROS otherwise unremarkable.  EKGs/Labs/Other Studies Reviewed:    The following studies were reviewed today: Prior vascular studies reviewed No prior cardiac studies  EKG:  EKG is personally reviewed.  The ekg ordered today demonstrates NSR at 72 bpm with  artifact  Recent Labs: 06/04/2019: ALT 24 06/06/2019: Magnesium 2.0 06/07/2019: BUN 13; Creatinine, Ser 1.03; Hemoglobin 13.4; Platelets 243; Potassium 4.4; Sodium 137  Recent Lipid Panel No results found for: CHOL, TRIG, HDL, CHOLHDL, VLDL, LDLCALC, LDLDIRECT  Physical Exam:    VS:  BP (!) 144/90   Pulse 72   Ht 6' (1.829 m)   Wt 218 lb (98.9 kg)   SpO2 96%   BMI 29.57 kg/m     Wt Readings from Last 3 Encounters:  05/07/20 218 lb (98.9 kg)  02/23/20 217 lb (98.4 kg)  07/07/19 208 lb 9.6 oz (94.6 kg)    GEN: Well nourished, well developed in no acute distress HEENT: Normal, moist mucous membranes NECK: No JVD CARDIAC: regular rhythm, normal S1 and S2, no rubs or gallops. No murmur. VASCULAR: Radial and DP pulses 2+ bilaterally. No carotid bruits RESPIRATORY:  Clear to auscultation without rales, wheezing or rhonchi  ABDOMEN: Soft, non-tender, non-distended MUSCULOSKELETAL:  Ambulates independently SKIN: Warm and dry, no edema NEUROLOGIC:  Alert and oriented x 3. No focal neuro deficits noted. PSYCHIATRIC:  Normal affect    ASSESSMENT:    1. AAA (abdominal aortic aneurysm) without rupture ([place])   2. Essential hypertension   3. Hyperlipidemia, unspecified hyperlipidemia type   4. Status post endovascular aneurysm repair (EVAR)   5. Cardiac risk counseling   6. Counseling on health promotion and disease prevention    PLAN:    AAA s/p EVAR  -follows with Dr. Trula Slade -former smoker -continue aspirin, pravastatin  Hypertension: -goal <130/80. Not at goal in office but reports normal numbers at home. He has follow up with his PCP and will contact me if BP still above goal at that visit -he is no longer on atenolol. With AAA repair, would consider carvedilol in the future -on diltiazem, no indication for rate agent. Would consider changing this to amlodipine as first step if BP rises -also not on diuretic, would consider chlorthalidone if needed in the  future  Hyperlipidemia: -will ask for his results to be sent from Dr. Glendell Docker office -for now, can continue pravastatin, but given his AAA would consider changing to rosuvastatin or atorvastatin in the future  Cardiac risk counseling and prevention recommendations: -recommend heart healthy/Mediterranean diet, with whole grains, fruits, vegetable, fish, lean meats, nuts, and olive oil. Limit salt. -recommend moderate walking, 3-5 times/week for 30-50 minutes each session. Aim for at least 150 minutes.week. Goal should be pace of 3 miles/hours, or walking 1.5 miles in 30 minutes -recommend avoidance of tobacco products. Avoid excess alcohol.  -ASCVD risk score: The 10-year ASCVD risk score Mikey Bussing DC Brooke Bonito., et al., 2013) is: 29.9%   Values used to calculate the score:     Age: 87 years     Sex: Male     Is Non-Hispanic African American: Yes     Diabetic: Yes  Tobacco smoker: No     Systolic Blood Pressure: 700 mmHg     Is BP treated: Yes     HDL Cholesterol: 47 MG/DL     Total Cholesterol: 160 MG/DL    Plan for follow up: 1 year or sooner as needed  Buford Dresser, MD, PhD [place]  [facility]    Medication Adjustments/Labs and Tests Ordered: Current medicines are reviewed at length with the patient today.  Concerns regarding medicines are outlined above.  Orders Placed This Encounter  Procedures   EKG 12-Lead   No orders of the defined types were placed in this encounter.   Patient Instructions  Medication Instructions:  Your Physician recommend you continue on your current medication as directed.    *If you need a refill on your cardiac medications before your next appointment, please call your pharmacy*   Lab Work: None   Testing/Procedures: None   Follow-Up: At [facility], you and your health needs are our priority.  As part of our continuing mission to provide you with exceptional heart care, we have created designated Provider Care Teams.   These Care Teams include your primary Cardiologist (physician) and Advanced Practice Providers (APPs -  Physician Assistants and Nurse Practitioners) who all work together to provide you with the care you need, when you need it.  We recommend signing up for the patient portal called "MyChart".  Sign up information is provided on this After Visit Summary.  MyChart is used to connect with patients for Virtual Visits (Telemedicine).  Patients are able to view lab/test results, encounter notes, upcoming appointments, etc.  Non-urgent messages can be sent to your provider as well.   To learn more about what you can do with MyChart, go to [email or web address].    Your next appointment:   1 year(s) @ [address] [place], [place] [id number]   The format for your next appointment:   In Person  Provider:   Buford Dresser, MD    Signed, Buford Dresser, MD PhD 05/07/2020    [place]

## 2020-08-29 ENCOUNTER — Encounter: Payer: Self-pay | Admitting: Cardiology

## 2020-08-29 DIAGNOSIS — E785 Hyperlipidemia, unspecified: Secondary | ICD-10-CM | POA: Insufficient documentation

## 2020-08-29 DIAGNOSIS — I10 Essential (primary) hypertension: Secondary | ICD-10-CM | POA: Insufficient documentation

## 2020-08-29 DIAGNOSIS — Z8679 Personal history of other diseases of the circulatory system: Secondary | ICD-10-CM | POA: Insufficient documentation

## 2020-08-29 DIAGNOSIS — Z9889 Other specified postprocedural states: Secondary | ICD-10-CM | POA: Insufficient documentation

## 2020-12-26 DIAGNOSIS — J939 Pneumothorax, unspecified: Secondary | ICD-10-CM | POA: Insufficient documentation

## 2021-04-03 ENCOUNTER — Other Ambulatory Visit: Payer: Self-pay

## 2021-04-03 DIAGNOSIS — I714 Abdominal aortic aneurysm, without rupture, unspecified: Secondary | ICD-10-CM

## 2021-04-11 ENCOUNTER — Ambulatory Visit: Payer: Medicare Other

## 2021-04-11 ENCOUNTER — Other Ambulatory Visit (HOSPITAL_COMMUNITY): Payer: Medicare Other

## 2021-05-09 NOTE — Progress Notes (Signed)
?Cardiology Office Note:   ? ?Date:  05/10/2021  ? ?ID:  Lawrence Sandoval, DOB 14-Apr-1955, MRN [id number] ? ?PCP:  Wynelle Fanny, DO  ?Cardiologist:  Buford Dresser, MD ? ?Referring MD: Wynelle Fanny, DO  ? ?CC: follow up ? ?History of Present Illness:   ? ?Lawrence Sandoval is a 66 y.o. male with a hx of AAA, hypertension, hypercholesterolemia who is seen for follow up today. I initially met him 05/08/19 as a new consult at the request of Wynelle Fanny, DO for the evaluation and management of preoperative cardiovascular risk. ? ?Pertinent past cardiac history: hypertension, hyperlipidemia, AAA ?OSA on CPAP ?Quit smoking and drinking 10-12 years ago. ? ?Today: ?Since his last visit, he was admitted to the hospital 12/25/2020 and 12/27/2020 in [facility]. He underwent robotic wedge resection on 12/29/2020, subsequent to primary spontaneous pneumothorax. ? ?Overall, he still suffers from central and right chest pain and soreness which concerns him. Sometimes he also feels a burning sensation in the same areas. This may be related to his resection in 12/2020, but he is not sure. Generally his pain is not noticeable during the day unless he twists his abdomen certain ways. His pain is the worst lying down at night, sometimes worse with positional changes and sometimes relieved when lying down a certain way. He is on Lidocaine patches with limited relief, and he states tylenol is not very effective as his body seems accustomed to tylenol.  ? ?Also, he endorses shortness of breath sometimes while talking. He will need to stop and take a deep breath. ? ?At home he has not been monitoring his BP as he has focused on recovery. In clinic today it is 146/80. He attributes this mostly to his ongoing pain. He confirms being on atenolol in the past, but does not believe he developed any reactions. Pravastatin is his only medication that he takes at night. ? ?He has successfully cut back on sodium in his diet, and continues to work on other  dietary changes. ? ?He denies any palpitations, or peripheral edema. No lightheadedness, headaches, syncope, orthopnea, or PND. ? ?Since his surgery he has been following up with his PCP every few weeks. ? ? ?Past Medical History:  ?Diagnosis Date  ? AAA (abdominal aortic aneurysm)   ? Herniated lumbar intervertebral disc   ? Hyperlipidemia   ? Hypertension   ? Hyperthyroidism   ? Lumbago   ? Sleep apnea   ? ? ?Past Surgical History:  ?Procedure Laterality Date  ? ABDOMINAL AORTIC ENDOVASCULAR STENT GRAFT N/A 06/06/2019  ? Procedure: ABDOMINAL AORTIC ENDOVASCULAR STENT GRAFT COIL WITH RIGHT Hypogastric artery;  Surgeon: Serafina Mitchell, MD;  Location: [place];  Service: Vascular;  Laterality: N/A;  ? HAND SURGERY Left 1995  ? Hand and Arm injury.   ? HERNIA REPAIR    ? IR US GUIDE VASC ACCESS RIGHT  04/03/2019  ? IR US GUIDE VASC ACCESS RIGHT  07/02/2019  ? ULTRASOUND GUIDANCE FOR VASCULAR ACCESS Bilateral 06/06/2019  ? Procedure: Ultrasound Guidance For Vascular Access, bilateral femoral arteries;  Surgeon: Serafina Mitchell, MD;  [facility];  Service: Vascular;  Laterality: Bilateral;  ? ? ?Current Medications: ?Current Outpatient Medications on File Prior to Visit  ?Medication Sig  ? acetaminophen (TYLENOL) 500 MG tablet Take 1,000 mg by mouth every 6 (six) hours as needed for mild pain.  ? aspirin EC 81 MG tablet Take 1 tablet (81 mg total) by mouth daily.  ? diltiazem (CARDIZEM CD) 360 MG  24 hr capsule Take 360 mg by mouth daily.  ? lidocaine (LIDODERM) 5 % 2 patches daily.  ? Loratadine-Pseudoephedrine (CLARITIN-D 24 HOUR PO) Take by mouth as needed.  ? methimazole (TAPAZOLE) 5 MG tablet Take 5 mg by mouth every other day.  ? Multiple Vitamin (QUINTABS) TABS Take 1 tablet by mouth daily.  ? omeprazole (PRILOSEC) 40 MG capsule Take 40 mg by mouth daily.   ? pravastatin (PRAVACHOL) 40 MG tablet Take 40 mg by mouth at bedtime.   ? Pseudoeph-Doxylamine-DM-APAP (NYQUIL MULTI-SYMPTOM PO) Take 30 mLs by mouth at  bedtime as needed (Cold).  ? ?No current facility-administered medications on file prior to visit.  ?  ? ?Allergies:   Gabapentin  ? ?Social History  ? ?Tobacco Use  ? Smoking status: Former  ?  Years: 35.00  ?  Types: Cigarettes  ?  Quit date: 02/18/2003  ?  Years since quitting: 18.2  ? Smokeless tobacco: Never  ?Vaping Use  ? Vaping Use: Never used  ?Substance Use Topics  ? Alcohol use: No  ?  Comment: quit abusing alcohol about 10 years ago  ? Drug use: No  ? ? ?Family History: ?family history includes Alcohol abuse in his father; Heart disease in his mother; Hypertension in his father; Stroke in his maternal grandfather. ? ?ROS:   ?Please see the history of present illness.   ?(+) Chest pain and soreness, burning sensations ?(+) Shortness of breath ?Additional pertinent ROS otherwise unremarkable. ? ?EKGs/Labs/Other Studies Reviewed:   ? ?The following studies were reviewed today: ?Prior vascular studies reviewed ? ?CT Chest 12/27/2020 ([place]): ?Impression: ?1.  Persistent large right pneumothorax status post right anterior percutaneous chest tube placement. There is associated leftward mediastinal shift, correlate with evidence of tension physiology  ?2.  Extensive subcutaneous emphysema predominantly in the right anterior chest wall. Of note, the tract of the prior anterior chest tube which has since been removed remains visible and may be origin of subcutaneous emphysema. ? ?CTA Chest/Aorta 04/03/2019: ?FINDINGS: ?CTA CHEST FINDINGS ?  ?Cardiovascular: Heart size normal. No pericardial effusion. ?Satisfactory opacification of pulmonary arteries noted, and there is ?no evidence of pulmonary emboli. ?  ?Coronary calcifications. ?  ?Adequate contrast opacification of the thoracic aorta with no ?evidence of dissection, aneurysm, or stenosis. There is classic ?3-vessel brachiocephalic arch anatomy without proximal stenosis. ?Minimal atheromatous change in the descending thoracic aorta. ?   ?Mediastinum/Nodes: No enlarged mediastinal, hilar, or axillary lymph ?nodes. Thyroid gland, trachea, and esophagus demonstrate no ?significant findings. ?  ?Lungs/Pleura: No pleural effusion. No pneumothorax. Subpleural blebs ?in both upper lobes as before. Stable 6 mm subpleural nodule, ?lingula (Im99,Se15) . stable 6 mm ground-glass nodule, anterior ?right upper lobe (Im65,Se15) . no new nodule or infiltrate. ?  ?Musculoskeletal: No chest wall abnormality. No acute or significant ?osseous findings. Missing dentition. ?  ?Review of the MIP images confirms the above findings. ?  ?CTA ABDOMEN AND PELVIS FINDINGS ?  ?VASCULAR ?  ?Aorta: Bilobed infrarenal aneurysm. Proximal segment 5 x 4.9 cm ?maximum transverse dimensions, inferior segment 5 cm. Nonocclusive ?eccentric mural thrombus in the inferior component. No dissection or ?stenosis. ?  ?Celiac: Patent without evidence of aneurysm, dissection, vasculitis ?or significant stenosis. ?  ?SMA: Patent without evidence of aneurysm, dissection, vasculitis or ?significant stenosis. Replaced right hepatic arterial supply, an ?anatomic variant. ?  ?Renals: Single left, widely patent. ?  ?Single right, widely patent. ?  ?IMA: Patent, arising from the proximal moiety of the bilobed aortic ?aneurysm. ?  ?  Inflow: Direct extension of aortic aneurysm into the right common ?iliac artery which measures up to 2.4 cm diameter. There is ?dilatation of the distal left common iliac artery up to 2.6 cm ?diameter. 1.4 cm dilatation of the proximal right internal iliac ?artery. External iliac arteries are mildly atheromatous, tortuous, ?ectatic. No dissection or stenosis. ?  ?Visualized proximal lower extremity arterial outflow mildly ?atheromatous but patent. ?  ?Veins: No obvious venous abnormality within the limitations of this ?arterial phase study. ?  ?Review of the MIP images confirms the above findings. ?  ?NON-VASCULAR ?  ?Hepatobiliary: 1.4 cm hypervascular lesion in segment 4A,  stable. ?Smaller similar 0.6 cm hypervascular lesion in segment 8, stable. ?Smaller hypervascular focus at the dome, stable. No new lesion. No ?biliary ductal dilatation. Gallbladder unremarkable. ?  ?Pancreas: Unremarkable

## 2021-05-10 ENCOUNTER — Other Ambulatory Visit: Payer: Self-pay

## 2021-05-10 ENCOUNTER — Ambulatory Visit (INDEPENDENT_AMBULATORY_CARE_PROVIDER_SITE_OTHER): Payer: Medicare Other | Admitting: Cardiology

## 2021-05-10 ENCOUNTER — Encounter (HOSPITAL_BASED_OUTPATIENT_CLINIC_OR_DEPARTMENT_OTHER): Payer: Self-pay | Admitting: Cardiology

## 2021-05-10 VITALS — BP 144/72 | HR 68 | Ht 72.0 in | Wt 201.6 lb

## 2021-05-10 DIAGNOSIS — Z7189 Other specified counseling: Secondary | ICD-10-CM

## 2021-05-10 DIAGNOSIS — Z8679 Personal history of other diseases of the circulatory system: Secondary | ICD-10-CM

## 2021-05-10 DIAGNOSIS — I10 Essential (primary) hypertension: Secondary | ICD-10-CM | POA: Diagnosis not present

## 2021-05-10 DIAGNOSIS — E785 Hyperlipidemia, unspecified: Secondary | ICD-10-CM | POA: Diagnosis not present

## 2021-05-10 DIAGNOSIS — I714 Abdominal aortic aneurysm, without rupture, unspecified: Secondary | ICD-10-CM | POA: Diagnosis not present

## 2021-05-10 DIAGNOSIS — Z9889 Other specified postprocedural states: Secondary | ICD-10-CM

## 2021-05-10 MED ORDER — CARVEDILOL 12.5 MG PO TABS: 12.5000 mg | ORAL_TABLET | Freq: Two times a day (BID) | ORAL | 3 refills | Status: AC

## 2021-05-10 NOTE — Patient Instructions (Signed)
Medication Instructions:  ?We are going to switch diltiazem to carvedilol 12.5 mg twice a day to try to protect your aorta better. Goal blood pressure is <130/80. If your blood pressure is consistently higher than this, please let me know your blood pressure and heart rate and we may go up on the dose of this medicine or add another. ? ?*If you need a refill on your cardiac medications before your next appointment, please call your pharmacy* ? ? ?Lab Work: ?None ordered today ? ? ?Testing/Procedures: ?None ordered today ? ? ?Follow-Up: ?At [facility], you and your health needs are our priority.  As part of our continuing mission to provide you with exceptional heart care, we have created designated Provider Care Teams.  These Care Teams include your primary Cardiologist (physician) and Advanced Practice Providers (APPs -  Physician Assistants and Nurse Practitioners) who all work together to provide you with the care you need, when you need it. ? ?We recommend signing up for the patient portal called "MyChart".  Sign up information is provided on this After Visit Summary.  MyChart is used to connect with patients for Virtual Visits (Telemedicine).  Patients are able to view lab/test results, encounter notes, upcoming appointments, etc.  Non-urgent messages can be sent to your provider as well.   ?To learn more about what you can do with MyChart, go to [email or web address].   ? ?Your next appointment:   ?1 year(s) ? ?The format for your next appointment:   ?In Person ? ?Provider:   ?Jodelle Red, MD{ ? ?-how to check blood pressure: ? -sit comfortably in a chair, feet uncrossed and flat on floor, for 5-10 minutes ? -arm ideally should rest at the level of the heart. However, arm should be relaxed and not tense (for example, do not hold the arm up unsupported) ? -avoid exercise, caffeine, and tobacco for at least 30 minutes prior to BP reading ? -don't take BP cuff reading over clothes (always place on  skin directly) ? -I prefer to know how well the medication is working, so I would like you to take your readings 1-2 hours after taking your blood pressure medication if possible  ?

## 2021-05-16 ENCOUNTER — Telehealth: Payer: Self-pay | Admitting: Cardiology

## 2021-05-16 NOTE — Telephone Encounter (Signed)
RN returned call to patient and instructed him to check his BP 1-2 times daily for the rest of the week. RN will call on Friday to get updated blood pressure readings.  ? ? ?Medications he is currently taking:  ?ASA 81 mg daily  ?Carv 12.5mg  BID  ?Pravastatin 40mg  daily  ?Omeprazole 40mg   ? ? ? ?"Agree--weekly BP readings, will need to confirm his medication list/dosages too so that we know what we can change if needed"- Dr.  ?

## 2021-05-16 NOTE — Telephone Encounter (Signed)
Okay to tell patient to monitor BP for one week and send in readings?  ?

## 2021-05-16 NOTE — Telephone Encounter (Signed)
Agree--weekly BP readings, will need to confirm his medication list/dosages too so that we know what we can change if needed

## 2021-05-16 NOTE — Telephone Encounter (Signed)
Pt c/o BP issue: STAT if pt c/o blurred vision, one-sided weakness or slurred speech ? ?1. What are your last 5 BP readings? He didn't write down.  140-150's/ 100's    ? ?2. Are you having any other symptoms (ex. Dizziness, headache, blurred vision, passed out)? No  ? ?3. What is your BP issue? Patient was put in new medication he doesn't know if dosage should to be raised or be but on different medication, or put back on his old medication.   ?

## 2021-05-20 ENCOUNTER — Telehealth (HOSPITAL_BASED_OUTPATIENT_CLINIC_OR_DEPARTMENT_OTHER): Payer: Self-pay

## 2021-05-20 ENCOUNTER — Telehealth: Payer: Self-pay | Admitting: Cardiology

## 2021-05-20 DIAGNOSIS — Z79899 Other long term (current) drug therapy: Secondary | ICD-10-CM

## 2021-05-20 NOTE — Telephone Encounter (Signed)
Pt c/o BP issue: STAT if pt c/o blurred vision, one-sided weakness or slurred speech ? ?1. What are your last 5 BP readings?  ? ?05/17/21: 157/106 HR 84 9:30 AM, 162/106 HR 84 11:00 AM, 136/80 HR 79 1:50 PM, 131/84 HR 83 8:00 PM, 152/92 HR 70 10:00 PM ? ?05/18/21: 151/91 HR 81 9:30 AM, 147/101 HR 83 11:00 AM, 162/98 HR 76 2:00 PM, 148/90 HR 75 3:00 PM, 154/101 HR 76 4:00 PM, 144/92 HR 89 6:00 PM, 150/98 HR 75 10:00 PM ? ?05/19/21: 136/87 HR 73 9:30 AM, 163/102 HR 67 10:30-11:00 AM, 136/88 HR 70 12-1:00 PM, 135/90 HR 80 5:00 PM, 151/94 HR 73 10:00 PM ? ?05/20/21: 135/87 HR 76 9:00 AM  ? ?2. Are you having any other symptoms (ex. Dizziness, headache, blurred vision, passed out)? No  ? ?3. What is your BP issue? Thinks BP runs high when he is in pain from surgery on his ribcage/lung. Also reported he thinks allergies may be playing a part in the hypertension as well.  ? ? ?

## 2021-05-20 NOTE — Telephone Encounter (Signed)
BP log as requested 

## 2021-05-20 NOTE — Telephone Encounter (Signed)
-----   Message from Marlene Lard, RN sent at 05/16/2021 10:24 AM EDT ----- ?Check in to get BP logs  ? ?

## 2021-05-24 NOTE — Telephone Encounter (Signed)
Pt is calling to f.u

## 2021-05-24 NOTE — Telephone Encounter (Signed)
Patient calling in to follow up on BP log sent in  ?

## 2021-06-02 MED ORDER — CHLORTHALIDONE 25 MG PO TABS: 12.5000 mg | ORAL_TABLET | Freq: Every day | ORAL | 3 refills | Status: DC

## 2021-06-02 NOTE — Telephone Encounter (Signed)
Lets start him on chlorthalidone 12.5 mg daily.  He will need BMET in 10-14 days.  He has previously had low blood sodium levels, hopefully they won't go low with this.   ?

## 2021-06-02 NOTE — Telephone Encounter (Signed)
Pt updated and verbalized understanding.  

## 2021-06-06 ENCOUNTER — Ambulatory Visit (INDEPENDENT_AMBULATORY_CARE_PROVIDER_SITE_OTHER): Payer: Medicare Other | Admitting: Physician Assistant

## 2021-06-06 ENCOUNTER — Encounter (HOSPITAL_BASED_OUTPATIENT_CLINIC_OR_DEPARTMENT_OTHER): Payer: Self-pay | Admitting: Cardiology

## 2021-06-06 ENCOUNTER — Telehealth: Payer: Self-pay | Admitting: Cardiology

## 2021-06-06 ENCOUNTER — Ambulatory Visit (HOSPITAL_COMMUNITY)
Admission: RE | Admit: 2021-06-06 | Discharge: 2021-06-06 | Disposition: A | Discharge status: Home / Self Care | Payer: Medicare Other | Source: Ambulatory Visit | Attending: Surgery | Admitting: Surgery

## 2021-06-06 VITALS — BP 145/100 | HR 78 | Temp 97.9°F | Resp 18 | Ht 72.0 in | Wt 197.0 lb

## 2021-06-06 DIAGNOSIS — I714 Abdominal aortic aneurysm, without rupture, unspecified: Secondary | ICD-10-CM | POA: Diagnosis present

## 2021-06-06 NOTE — Progress Notes (Addendum)
[facility] [place] ?HISTORY AND PHYSICAL  ? ?History of Present Illness:  Patient is a 66 y.o. year old male who presents for evaluation of abdominal aortic aneurysm.  He is s/p EVAR repair 06/06/19 with  right internal iliac coiling.  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   ? He is doing well without complaints of abdominal/lumbar pain.  He is maximally medically managed on Stain, ASA and HTN medications.  ? ?Past Medical History:  ?Diagnosis Date  ? AAA (abdominal aortic aneurysm) (HCC)   ? Herniated lumbar intervertebral disc   ? Hyperlipidemia   ? Hypertension   ? Hyperthyroidism   ? Lumbago   ? Sleep apnea   ? ? ?Past Surgical History:  ?Procedure Laterality Date  ? ABDOMINAL AORTIC ENDOVASCULAR STENT GRAFT N/A 06/06/2019  ? Procedure: ABDOMINAL AORTIC ENDOVASCULAR STENT GRAFT COIL WITH RIGHT Hypogastric artery;  Surgeon: Nada Libman, MD;  Location: [facility] OR;  Service: Vascular;  Laterality: N/A;  ? HAND SURGERY Left 1995  ? Hand and Arm injury.   ? HERNIA REPAIR    ? IR US GUIDE VASC ACCESS RIGHT  04/03/2019  ? IR US GUIDE VASC ACCESS RIGHT  07/02/2019  ? ULTRASOUND GUIDANCE FOR VASCULAR ACCESS Bilateral 06/06/2019  ? Procedure: Ultrasound Guidance For Vascular Access, bilateral femoral arteries;  Surgeon: Nada Libman, MD;  Location: [facility] OR;  Service: Vascular;  Laterality: Bilateral;  ? ? ? ?Social History ?Social History  ? ?Tobacco Use  ? Smoking status: Former  ?  Years: 35.00  ?  Types: Cigarettes  ?  Quit date: 02/18/2003  ?  Years since quitting: 18.3  ? Smokeless tobacco: Never  ?Vaping Use  ? Vaping Use: Never used  ?Substance Use Topics  ? Alcohol use: No  ?  Comment: quit abusing alcohol about 10 years ago  ? Drug use: No  ? ? ?Family History ?Family History  ?Problem Relation Age of Onset  ? Heart disease Mother   ?     CHF  ? Alcohol abuse Father   ? Hypertension Father   ? Stroke Maternal Grandfather   ? ? ?Allergies ? ?Allergies  ?Allergen Reactions  ?  Gabapentin Nausea And Vomiting  ?  headache  ? ? ? ?Current Outpatient Medications  ?Medication Sig Dispense Refill  ? acetaminophen (TYLENOL) 500 MG tablet Take 1,000 mg by mouth every 6 (six) hours as needed for mild pain.    ? aspirin EC 81 MG tablet Take 1 tablet (81 mg total) by mouth daily.    ? carvedilol (COREG) 12.5 MG tablet Take 1 tablet (12.5 mg total) by mouth 2 (two) times daily with a meal. 180 tablet 3  ? chlorthalidone (HYGROTON) 25 MG tablet Take 0.5 tablets (12.5 mg total) by mouth daily. 45 tablet 3  ? Multiple Vitamin (QUINTABS) TABS Take 1 tablet by mouth daily.    ? omeprazole (PRILOSEC) 40 MG capsule Take 40 mg by mouth daily.     ? pravastatin (PRAVACHOL) 40 MG tablet Take 40 mg by mouth at bedtime.     ? Pseudoeph-Doxylamine-DM-APAP (NYQUIL MULTI-SYMPTOM PO) Take 30 mLs by mouth at bedtime as needed (Cold).    ? lidocaine (LIDODERM) 5 % 2 patches daily. (Patient not taking: Reported on 06/06/2021)    ? Loratadine-Pseudoephedrine (CLARITIN-D 24 HOUR PO) Take by mouth as needed. (Patient not taking: Reported on 06/06/2021)    ? methimazole (TAPAZOLE) 5 MG tablet Take 5  mg by mouth every other day. (Patient not taking: Reported on 06/06/2021)    ? ?No current facility-administered medications for this visit.  ? ? ?ROS:  ? ?General:  No weight loss, Fever, chills ? ?HEENT: No recent headaches, no nasal bleeding, no visual changes, no sore throat ? ?Neurologic: No dizziness, blackouts, seizures. No recent symptoms of stroke or mini- stroke. No recent episodes of slurred speech, or temporary blindness. ? ?Cardiac: No recent episodes of chest pain/pressure, no shortness of breath at rest.  No shortness of breath with exertion.  Denies history of atrial fibrillation or irregular heartbeat ? ?Vascular: No history of rest pain in feet.  No history of claudication.  No history of non-healing ulcer, No history of DVT  ? ?Pulmonary: No home oxygen, no productive cough, no hemoptysis,  No asthma or  wheezing ? ?Musculoskeletal:  [ ]  Arthritis, [ ]  Low back pain,  [ ]  Joint pain ? ?Hematologic:No history of hypercoagulable state.  No history of easy bleeding.  No history of anemia ? ?Gastrointestinal: No hematochezia or melena,  No gastroesophageal reflux, no trouble swallowing ? ?Urinary: [ ]  chronic Kidney disease, [ ]  on HD - [ ]  MWF or [ ]  TTHS, [ ]  Burning with urination, [ ]  Frequent urination, [ ]  Difficulty urinating;  ? ?Skin: No rashes ? ?Psychological: No history of anxiety,  No history of depression ? ? ?Physical Examination ? ?Vitals:  ? 06/06/21 0821  ?BP: (!) 145/100  ?Pulse: 78  ?Resp: 18  ?Temp: 97.9 ?F (36.6 ?C)  ?TempSrc: Temporal  ?SpO2: 98%  ?Weight: 197 lb (89.4 kg)  ?Height: 6' (1.829 m)  ? ? ?Body mass index is 26.72 kg/m?. ? ?General:  Alert and oriented, no acute distress ?HEENT: Normal ?Neck: No bruit or JVD ?Pulmonary: Clear to auscultation bilaterally ?Cardiac: Regular Rate and Rhythm without murmur ?Gastrointestinal: Soft, non-tender, non-distended, no mass, no scars ?Skin: No rash, no ischemic changes to limbs ?Musculoskeletal: No deformity or edema  ?Neurologic: Upper and lower extremity motor 5/5 and symmetric ? ?DATA:  ?   ?Endovascular Aortic Repair (EVAR):  ?+----------+----------------+-------------------+-------------------+  ?          Diameter AP (cm)Diameter Trans (cm)Velocities (cm/sec)  ?+----------+----------------+-------------------+-------------------+  ?Aorta     5.02            5.05               72                   ?+----------+----------------+-------------------+-------------------+  ?Right Limb1.36            1.19               95                   ?+----------+----------------+-------------------+-------------------+  ?Left Limb 2.50            2.41               50                   ?+----------+----------------+-------------------+-------------------+  ? ?+-------------+------------------------------------------------------------   ?----+  ?Endoleak TypeReproducible color and Doppler signal seen in the residual  ?sac    ?             at the EVAR bifurcation consistent with type 2 endoleak.      ?      ?+-------------+------------------------------------------------------------  ?----+  ? ? Summary:  ?Abdominal Aorta: Patent endovascular aneurysm  repair with evidence of  ?endoleak.  ? ?ASSESSMENT/PLAN: ?AAA s/p EVAR repair with Coil Embolization of the right internal iliac artery ?The post op duplex has not changed with the AAA size reported at 5.02.  The duplex may demonstrated possible endo leak without enlargement of the AAA sac.  He remains asymptomatic for abdominal or lumbar pain.   ? He will f/u in 6 months for repeat duplex.  The left iliac limb size has remained the same as well at 2.5 cm.   He remains asymptomatic and continues to not have  any abdominal or back pain.  He will f/u in 6 months for repeat duplex. ? ? ? ? ? ? ?Mosetta PigeonEmma Maureen Burrell Hodapp ?PA-C ?[facility] [place] ?Office: [phone] ? ?MD in clinic [place] ?

## 2021-06-06 NOTE — Telephone Encounter (Signed)
Pt c/o medication issue: ? ?1. Name of Medication: chlorthalidone (HYGROTON) 25 MG tablet ? ?2. How are you currently taking this medication (dosage and times per day)? Take 0.5 tablets (12.5 mg total) by mouth daily. ? ?3. Are you having a reaction (difficulty breathing--STAT)? No ? ?4. What is your medication issue? Pt states that he thinks dosage may need to be changed due to BP running high.  ? ?Today - 137/96 ?4/15 - 166/109, 154/103 ?4/14 - 148/101, 158/109, 153/109 ? ?Please advise ? ? ? ?

## 2021-06-06 NOTE — Telephone Encounter (Signed)
Please advise 

## 2021-06-06 NOTE — Telephone Encounter (Signed)
RN returned call to patient and advised him to get his BMET drawn at [facility] so that we can look at that plus his BP's to see if meds need to be drawn.  ?

## 2021-06-10 ENCOUNTER — Other Ambulatory Visit: Payer: Self-pay | Admitting: *Deleted

## 2021-06-10 DIAGNOSIS — I714 Abdominal aortic aneurysm, without rupture, unspecified: Secondary | ICD-10-CM

## 2021-06-13 ENCOUNTER — Telehealth: Payer: Self-pay | Admitting: Cardiology

## 2021-06-13 NOTE — Telephone Encounter (Signed)
RN reached out to office to request labs be sent to our office. Updated patient  ?

## 2021-06-13 NOTE — Telephone Encounter (Signed)
See 4/17 encounter--Patient is following up. He states he was able to have his labs drawn last week on either Thursday, 4/20 or Friday, 4/21 with [facility]. He would like to confirm whether the results have been received. ?

## 2021-06-22 ENCOUNTER — Telehealth: Payer: Self-pay | Admitting: Cardiology

## 2021-06-22 NOTE — Telephone Encounter (Signed)
Patient wants to know if he needs to change the dosage on his BP medication. He states no has called him if they got the lab work from his doctor.  ?

## 2021-06-22 NOTE — Telephone Encounter (Signed)
Called and requested labs from PCP  ? ?Per patient blood pressure good in morning but goes up in the afternoon  ? ?Takes Carvedilol twice a day and Hydralazine 1/2  tablet in am  ? ?Has changed his diet, decreased his salt intake ? ?Advised to monitor and call back with readings in 1 week  ? ? ? ?

## 2021-07-01 ENCOUNTER — Telehealth: Payer: Self-pay | Admitting: Cardiology

## 2021-07-01 NOTE — Telephone Encounter (Signed)
Pt c/o BP issue: STAT if pt c/o blurred vision, one-sided weakness or slurred speech ? ?1. What are your last 5 BP readings?  ? ?5/04:    152/101 80  ? 142/104 76  ? 122/87  76 ? 139/99 90 ? ?5/05:    132/93 79 ? 137/93 78 ? 134/96 77 ? 114/82 80 ? ?5/06:    132/89 76 ? 135/90 76 ? 131/94 83 ? ?5/07:    125/87 76 ? 132/87 75 ? 138/95 76 ? ?5/08:    126/90 83 ? 125/88 87 ? 132/88 81 ? 109/84 93 ? 118/86 87 ? ?5/09:    125/84 85 ? 128/90 80 ? 117/85 86 ? 129/89 76 ? 122/86 90 ? ?5/10:    128/86 82 ? 130/90 80 ? 131/90 79 ? 137/94 82 ? 124/89 87 ? ?5/11:   135/95 80 ? 136/91 76 ? 137/90 72 ? 132/91 84 ? ?5/12:    124/94 87 ? 134/94 79 ? ?2. Are you having any other symptoms (ex. Dizziness, headache, blurred vision, passed out)?  ?Patient is asymptomatic ? ?3. What is your BP issue?  ?Patient states his diastolic has been elevated. He states the only thing he has changed recently was adding a Metamucil supplement to his diet. He would like to know if this may have affected his BP. ?

## 2021-07-14 NOTE — Telephone Encounter (Signed)
Metamucil should not affect diastolic numbers. The top numbers overall look good. Anything like caffeine, nicotine, cold medicine, other supplements? Those typically are more likely to raise the bottom number. Exercise/walking can help lower it.

## 2021-07-15 NOTE — Telephone Encounter (Signed)
Pt updated and verbalized understanding.  

## 2022-03-21 IMAGING — DX DG CHEST 1V PORT
1 series · 1 of 1 positions shown · non-contrast
Comparison: None.

CLINICAL DATA: Status post endovascular stent graft repair of
abdominal aorta.

EXAM:
PORTABLE CHEST 1 VIEW

[chest]
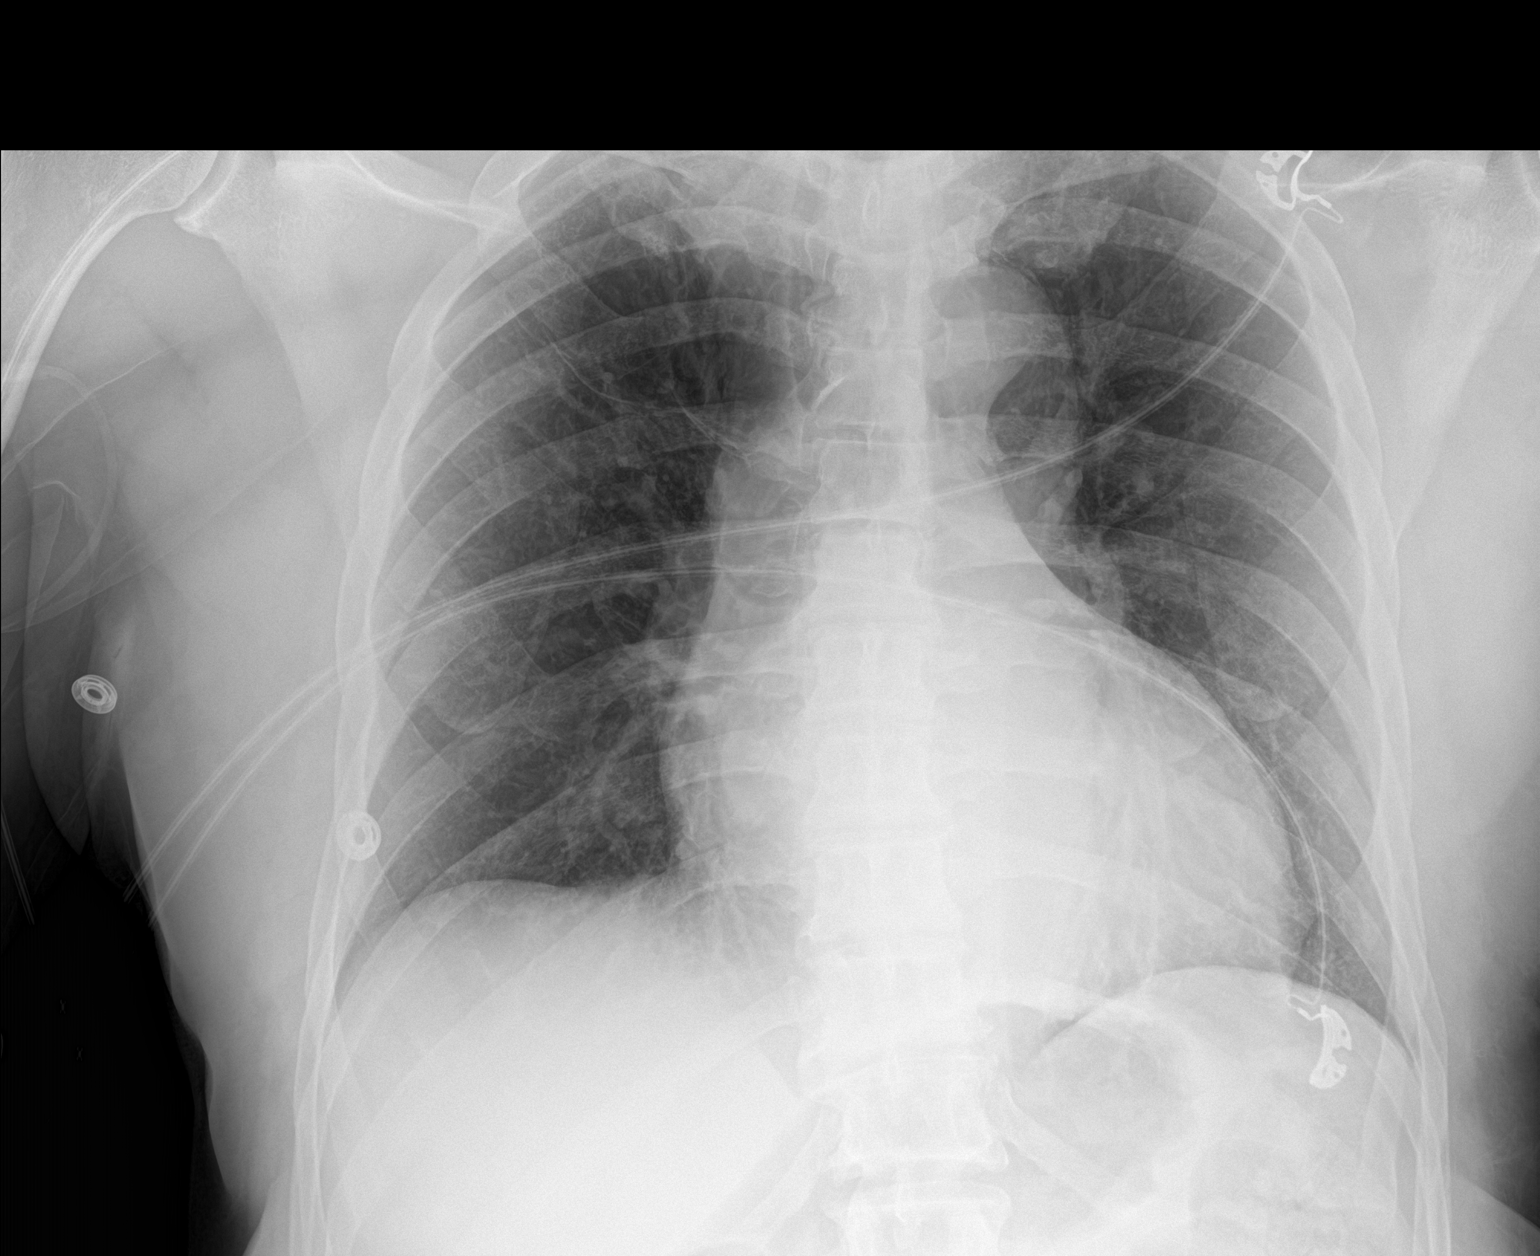

[1 of 1 positions shown; findings below may reference images not displayed]

FINDINGS: Mild cardiomegaly is noted. No pneumothorax or pleural effusion is
noted. Both lungs are clear. The visualized skeletal structures are
unremarkable.
IMPRESSION: No acute cardiopulmonary abnormality seen. Mild cardiomegaly.

## 2022-03-31 ENCOUNTER — Ambulatory Visit (HOSPITAL_COMMUNITY)
Admission: RE | Admit: 2022-03-31 | Discharge: 2022-03-31 | Disposition: A | Discharge status: Home / Self Care | Payer: 59 | Source: Ambulatory Visit | Attending: Vascular Surgery | Admitting: Vascular Surgery

## 2022-03-31 ENCOUNTER — Ambulatory Visit (INDEPENDENT_AMBULATORY_CARE_PROVIDER_SITE_OTHER): Payer: 59 | Admitting: Physician Assistant

## 2022-03-31 VITALS — BP 141/83 | HR 96 | Temp 97.5°F | Resp 20 | Ht 72.0 in | Wt 203.8 lb

## 2022-03-31 DIAGNOSIS — I714 Abdominal aortic aneurysm, without rupture, unspecified: Secondary | ICD-10-CM

## 2022-03-31 DIAGNOSIS — E0590 Thyrotoxicosis, unspecified without thyrotoxic crisis or storm: Secondary | ICD-10-CM | POA: Insufficient documentation

## 2022-03-31 NOTE — Progress Notes (Signed)
Office Note     CC:  follow up Requesting Provider:  Wynelle Fanny, DO  HPI: Lawrence Sandoval is a 67 y.o. (08/13/1955) male who presents for surveillance of endovascular AAA repair and left common iliac artery aneurysm repair by Dr. Trula Slade on 06/06/2019.  This also involved embolization of the right internal iliac artery.  He was noted to have a type II endoleak by postoperative duplex however AAA sac size remained stable.  He denies any new or changing abdominal or back pain.  He also denies any claudication or tissue changes of bilateral lower extremities.  He is on aspirin and statin daily.  He is a former smoker who quit 20 years ago.   Past Medical History:  Diagnosis Date   AAA (abdominal aortic aneurysm) ([place])    Herniated lumbar intervertebral disc    Hyperlipidemia    Hypertension    Hyperthyroidism    Lumbago    Sleep apnea     Past Surgical History:  Procedure Laterality Date   ABDOMINAL AORTIC ENDOVASCULAR STENT GRAFT N/A 06/06/2019   Procedure: ABDOMINAL AORTIC ENDOVASCULAR STENT GRAFT COIL WITH RIGHT Hypogastric artery;  Surgeon: Serafina Mitchell, MD;  Location: [facility] OR;  Service: Vascular;  Laterality: N/A;   HAND SURGERY Left 1995   Hand and Arm injury.    HERNIA REPAIR     IR US GUIDE VASC ACCESS RIGHT  04/03/2019   IR US GUIDE VASC ACCESS RIGHT  07/02/2019   ULTRASOUND GUIDANCE FOR VASCULAR ACCESS Bilateral 06/06/2019   Procedure: Ultrasound Guidance For Vascular Access, bilateral femoral arteries;  Surgeon: Serafina Mitchell, MD;  Location: [facility] OR;  Service: Vascular;  Laterality: Bilateral;    Social History   Socioeconomic History   Marital status: Unknown    Spouse name: Not on file   Number of children: Not on file   Years of education: Not on file   Highest education level: Not on file  Occupational History   Not on file  Tobacco Use   Smoking status: Former    Years: 35.00    Types: Cigarettes    Quit date: 02/18/2003    Years since quitting: 19.1     Passive exposure: Never   Smokeless tobacco: Never  Vaping Use   Vaping Use: Never used  Substance and Sexual Activity   Alcohol use: No    Comment: quit abusing alcohol about 10 years ago   Drug use: No   Sexual activity: Not on file  Other Topics Concern   Not on file  Social History Narrative   Not on file   Social Determinants of Health   Financial Resource Strain: Not on file  Food Insecurity: Not on file  Transportation Needs: Not on file  Physical Activity: Not on file  Stress: Not on file  Social Connections: Not on file  Intimate Partner Violence: Not on file    Family History  Problem Relation Age of Onset   Heart disease Mother        CHF   Alcohol abuse Father    Hypertension Father    Stroke Maternal Grandfather     Current Outpatient Medications  Medication Sig Dispense Refill   acetaminophen (TYLENOL) 500 MG tablet Take 1,000 mg by mouth every 6 (six) hours as needed for mild pain.     aspirin EC 81 MG tablet Take 1 tablet (81 mg total) by mouth daily.     carvedilol (COREG) 12.5 MG tablet Take 1 tablet (12.5 mg total) by  mouth 2 (two) times daily with a meal. 180 tablet 3   chlorthalidone (HYGROTON) 25 MG tablet Take 0.5 tablets (12.5 mg total) by mouth daily. 45 tablet 3   lidocaine (LIDODERM) 5 % 2 patches daily.     Loratadine-Pseudoephedrine (CLARITIN-D 24 HOUR PO) Take by mouth as needed.     methimazole (TAPAZOLE) 5 MG tablet Take 5 mg by mouth every other day.     Multiple Vitamin (QUINTABS) TABS Take 1 tablet by mouth daily.     omeprazole (PRILOSEC) 40 MG capsule Take 40 mg by mouth daily.      oxyCODONE (OXY IR/ROXICODONE) 5 MG immediate release tablet Take 5 mg by mouth 2 (two) times daily as needed.     pravastatin (PRAVACHOL) 40 MG tablet Take 40 mg by mouth at bedtime.      Pseudoeph-Doxylamine-DM-APAP (NYQUIL MULTI-SYMPTOM PO) Take 30 mLs by mouth at bedtime as needed (Cold).     No current facility-administered medications for this  visit.    Allergies  Allergen Reactions   Gabapentin Nausea And Vomiting    headache     REVIEW OF SYSTEMS:   [X]$  denotes positive finding, [ ]$  denotes negative finding Cardiac  Comments:  Chest pain or chest pressure:    Shortness of breath upon exertion:    Short of breath when lying flat:    Irregular heart rhythm:        Vascular    Pain in calf, thigh, or hip brought on by ambulation:    Pain in feet at night that wakes you up from your sleep:     Blood clot in your veins:    Leg swelling:         Pulmonary    Oxygen at home:    Productive cough:     Wheezing:         Neurologic    Sudden weakness in arms or legs:     Sudden numbness in arms or legs:     Sudden onset of difficulty speaking or slurred speech:    Temporary loss of vision in one eye:     Problems with dizziness:         Gastrointestinal    Blood in stool:     Vomited blood:         Genitourinary    Burning when urinating:     Blood in urine:        Psychiatric    Major depression:         Hematologic    Bleeding problems:    Problems with blood clotting too easily:        Skin    Rashes or ulcers:        Constitutional    Fever or chills:      PHYSICAL EXAMINATION:  Vitals:   03/31/22 0807  BP: (!) 141/83  Pulse: 96  Resp: 20  Temp: (!) 97.5 F (36.4 C)  TempSrc: Temporal  SpO2: 97%  Weight: 203 lb 12.8 oz (92.4 kg)  Height: 6' (1.829 m)    General:  WDWN in NAD; vital signs documented above Gait: Not observed HENT: WNL, normocephalic Pulmonary: normal non-labored breathing , without Rales, rhonchi,  wheezing Cardiac: regular HR Abdomen: soft, NT, no masses Skin: without rashes Vascular Exam/Pulses:  Right Left  Femoral 2+ (normal) 2+ (normal)  DP 2+ (normal) absent   Extremities: without ischemic changes, without Gangrene , without cellulitis; without open wounds;  Musculoskeletal: no muscle wasting or  atrophy  Neurologic: A&O X 3;  No focal weakness or  paresthesias are detected Psychiatric:  The pt has Normal affect.   Non-Invasive Vascular Imaging:   Endovascular Aortic Repair (EVAR):  +----------+----------------+-------------------+-------------------+           Diameter AP (cm)Diameter Trans (cm)Velocities (cm/sec)  +----------+----------------+-------------------+-------------------+  Aorta    5.32            5.89               81                   +----------+----------------+-------------------+-------------------+  Right Limb1.77            1.81               54                   +----------+----------------+-------------------+-------------------+  Left Limb 2.64            2.52               47                   +----------+----------------+-------------------+-------------------+      ASSESSMENT/PLAN:: 67 y.o. male here for surveillance of endovascular repair of AAA and left common iliac artery aneurysm by Dr. Trula Slade in April 2021  -Postoperatively patient has had evidence of endoleak by duplex however AAA sac size has remained stable.  Today AAA sac size has increased from 5 cm to 5.9 cm.  Plan will be to obtain CTA of the abdomen and pelvis and to follow-up with Dr. Trula Slade in the next couple weeks for potential repair.  Endoleak was discussed in detail with the patient and all questions were answered.  He is agreeable to CTA and follow-up in short order with Dr. Trula Slade.   Lawrence Ligas, PA-C [facility] [phone]  Clinic MD:   Virl Cagey

## 2022-04-01 ENCOUNTER — Other Ambulatory Visit: Payer: Self-pay

## 2022-04-01 DIAGNOSIS — I714 Abdominal aortic aneurysm, without rupture, unspecified: Secondary | ICD-10-CM

## 2022-04-13 ENCOUNTER — Ambulatory Visit (HOSPITAL_BASED_OUTPATIENT_CLINIC_OR_DEPARTMENT_OTHER): Payer: 59

## 2022-04-14 ENCOUNTER — Ambulatory Visit (HOSPITAL_BASED_OUTPATIENT_CLINIC_OR_DEPARTMENT_OTHER)
Admission: RE | Admit: 2022-04-14 | Discharge: 2022-04-14 | Disposition: A | Discharge status: Home / Self Care | Payer: 59 | Source: Ambulatory Visit | Attending: Surgery | Admitting: Surgery

## 2022-04-14 ENCOUNTER — Encounter (HOSPITAL_BASED_OUTPATIENT_CLINIC_OR_DEPARTMENT_OTHER): Payer: Self-pay

## 2022-04-14 DIAGNOSIS — I714 Abdominal aortic aneurysm, without rupture, unspecified: Secondary | ICD-10-CM | POA: Insufficient documentation

## 2022-04-14 MED ORDER — IOHEXOL 350 MG/ML SOLN
100.0000 mL | Freq: Once | INTRAVENOUS | Status: AC | PRN
  Administered 2022-04-14: 100 mL via INTRAVENOUS

## 2022-04-16 IMAGING — CT CT CTA ABD/PEL W/CM AND/OR W/O CM
2 of 10 series · 10 of 46 positions shown, 11 images · IV contrast (iopamidol)
Comparison: 04/03/2019

CLINICAL DATA: 64-year-old male with a history of abdominal aortic
aneurysm, recent MARIO RIBEIRO 06/06/2019

EXAM:
CTA ABDOMEN AND PELVIS WITHOUT AND WITH CONTRAST
TECHNIQUE: Multidetector CT imaging of the abdomen and pelvis was performed
using the standard protocol during bolus administration of
intravenous contrast. Multiplanar reconstructed images and MIPs were
obtained and reviewed to evaluate the vascular anatomy.
CONTRAST:  75mL [id number] IOPAMIDOL ([id number]) INJECTION 76%

[Series 7: cta arterial 2.00 bv36 s3 cor art st · axial · arterial · 0.57mm/px · z∈[+1245,+1612]mm · 8 of 225 slices shown, 9 images (1 of 2)]
[im 27/225  soft-tissue]
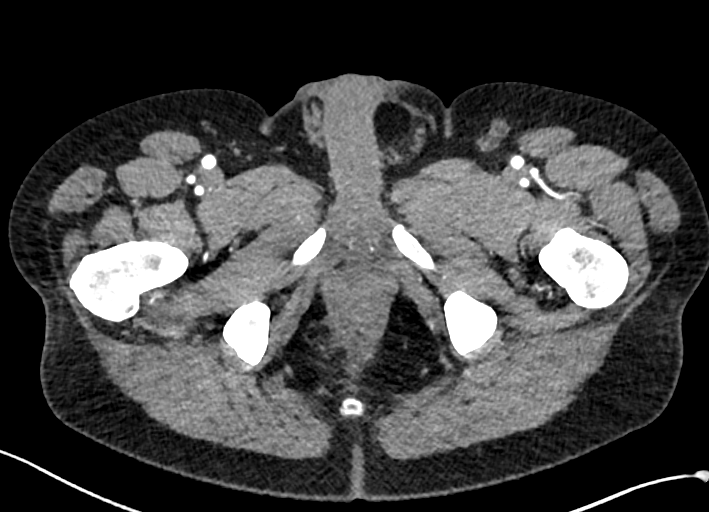
[im 27/225  bone]
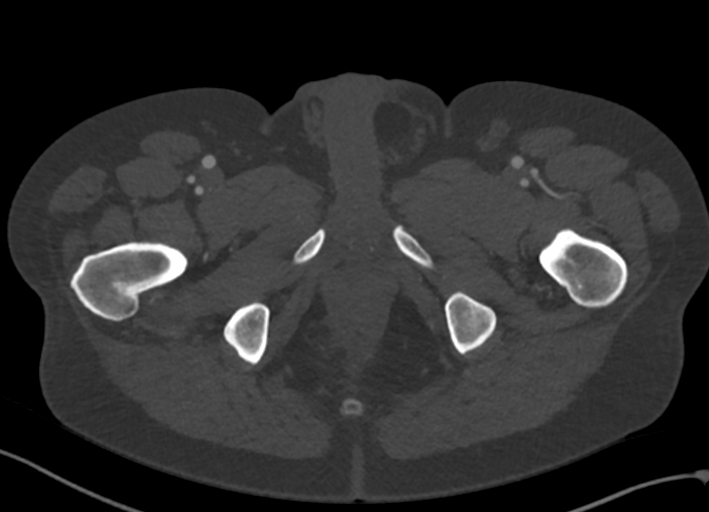
[im 53/225  soft-tissue]
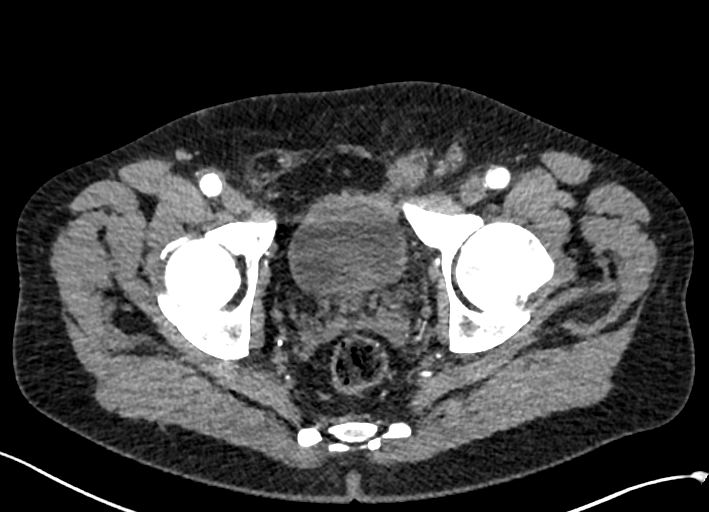
[im 80/225  soft-tissue]
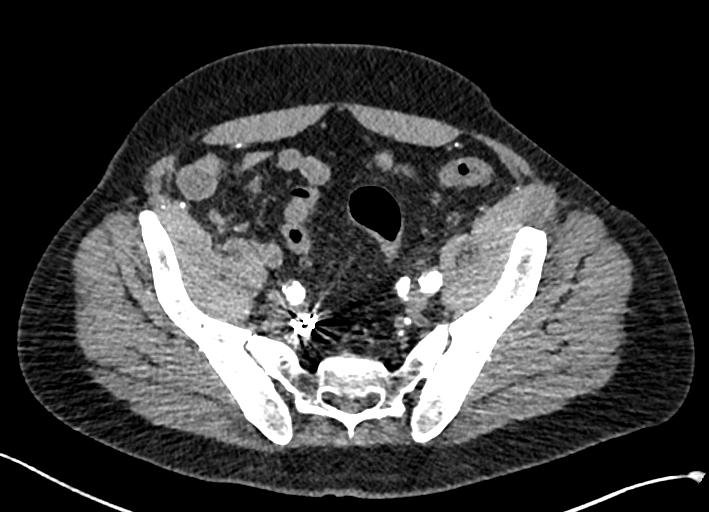
[im 106/225  soft-tissue]
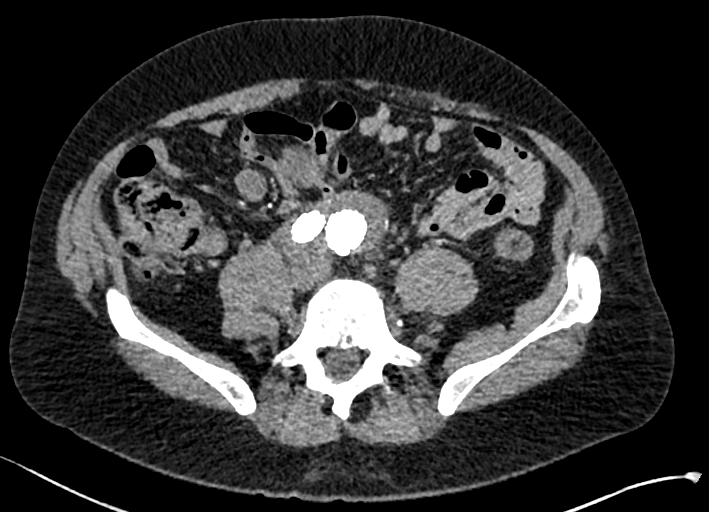
[im 132/225  soft-tissue]
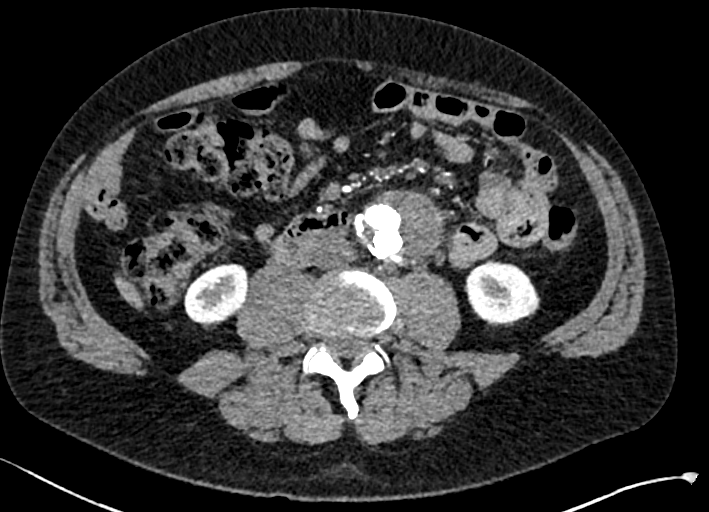
[im 159/225  soft-tissue]
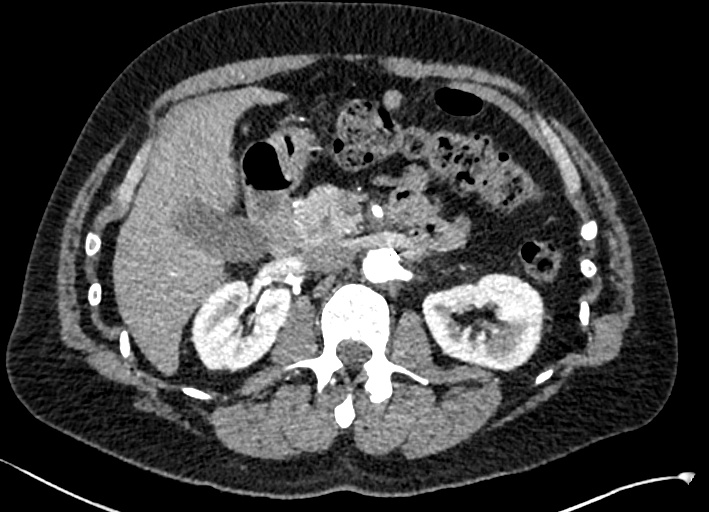
[im 185/225  soft-tissue]
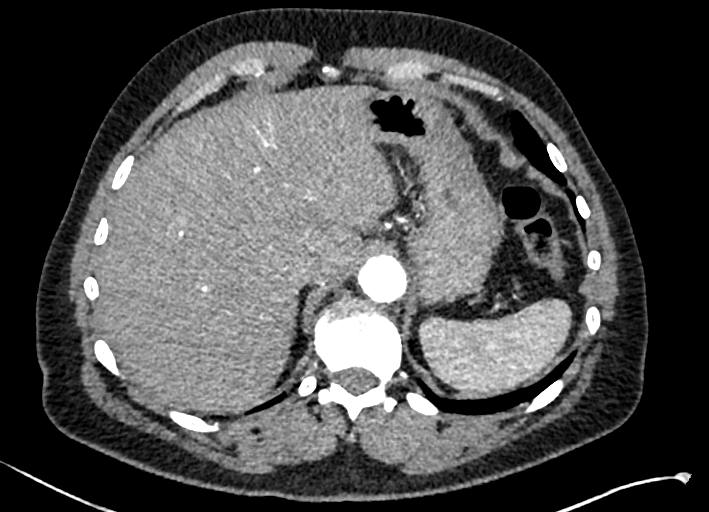
[im 211/225  soft-tissue]
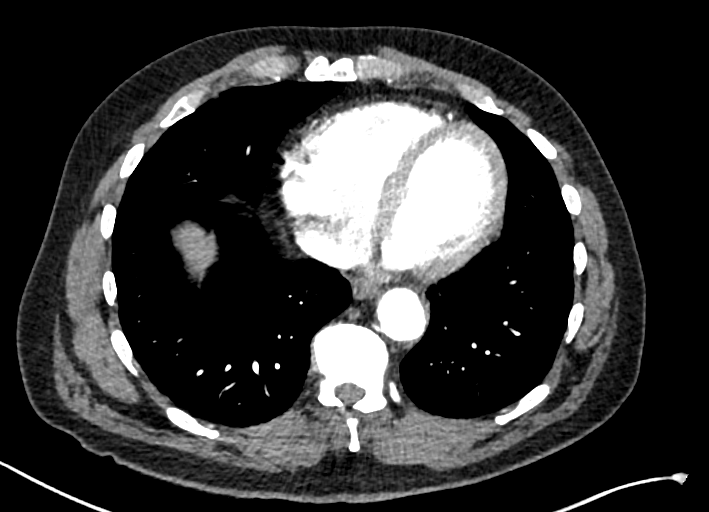

[Series 9: cta arterial 2.00 bv36 s3 cor art st · coronal · arterial · 0.78mm/px · 2 of 144 slices shown (2 of 2)]
[im 48/144  soft-tissue]
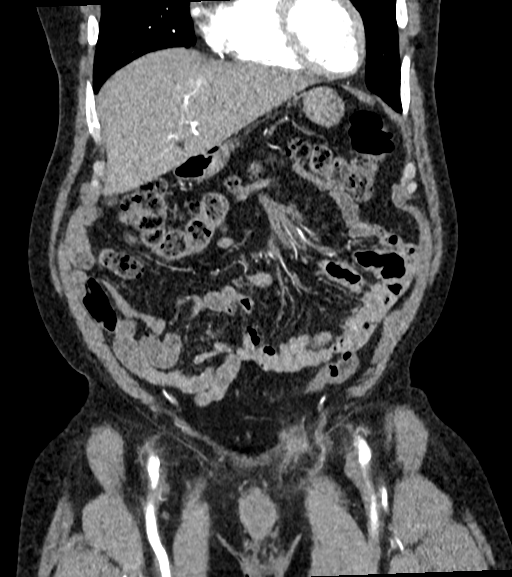
[im 96/144  soft-tissue]
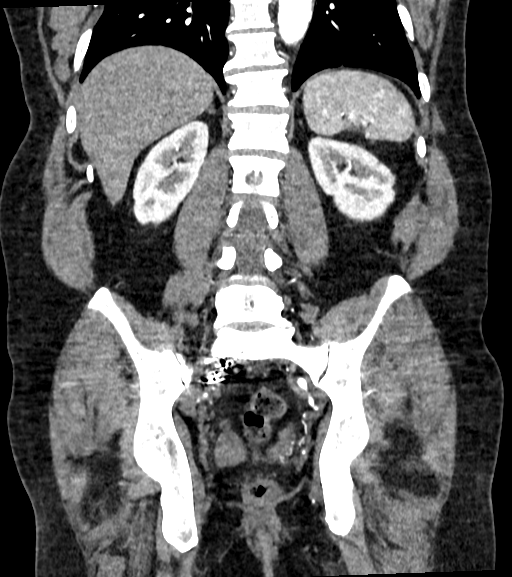

[10 of 46 positions shown; findings below may reference images not displayed]

FINDINGS: VASCULAR

Aorta: Mild atherosclerosis of the lower thoracic aorta. Aorta at
the hiatus measures 2.5 cm.

Interval endovascular repair of infrarenal abdominal aortic aneurysm
with stent graft using infrarenal fixation, main body delivered from
the right.

The proximal margin of the stent graft terminates just below the
lowest, left renal artery. Greatest diameter of the excluded
aneurysm sac is essentially unchanged from the comparison CT
measuring 4.9 cm on the axial images.

A type 2 endoleak is present on the current CT originating from
lumbar arteries.

No evidence of a type 1 a or type 1 B endoleak.

Celiac: Celiac artery patent without significant atherosclerotic
changes.

SMA: SMA patent without significant atherosclerotic changes.

Renals: Single right renal artery is patent without significant
atherosclerotic changes.

Single left renal artery patent without significant atherosclerotic
changes.

Unremarkable course caliber and contour of the bilateral renal
arteries.

IMA: IMA has been excluded.

Right lower extremity:

The right iliac limb terminates within the external iliac artery.
There has been coil embolization of the right hypogastric artery.
Mild tortuosity of the right external iliac artery without
significant stenosis.

Common femoral artery is patent with minimal atherosclerosis. No
focal fluid or inflammatory changes at the right common femoral
artery access site.

Proximal profunda femoris and SFA patent.

Left lower extremity:

The left iliac limb terminates within the common iliac artery above
the bifurcation. Hypogastric artery and external iliac artery
patent. Tortuosity of the left external iliac artery without
high-grade stenosis or occlusion.

Common femoral artery with minimal atherosclerosis. No focal fluid
or inflammatory changes at the left common femoral access site.

Proximal profunda femoris and SFA patent.

Veins: Unremarkable appearance of the venous system.

Review of the MIP images confirms the above findings.

NON-VASCULAR

Lower chest: No acute.

Hepatobiliary: Redemonstration of hypervascular foci of the liver on
the arterial phase which equilibrate to the venous blood phase on
the delayed images, and are presumably benign vascular lesions.
Benign biliary cyst again noted within segment 4. Unremarkable
gallbladder.

Pancreas: Unremarkable

Spleen: Unremarkable.

Adrenals/Urinary Tract: Unremarkable appearance of adrenal glands.

Right:

No hydronephrosis. Symmetric perfusion to the left. No
nephrolithiasis. Unremarkable course of the right ureter.

Left:

No hydronephrosis. Symmetric perfusion to the right. No
nephrolithiasis. Unremarkable course of the left ureter.

Urinary bladder partially distended. Redemonstration of the left
dome of the bladder extending into inguinal hernia with associated
mild inflammatory changes.

Stomach/Bowel: Unremarkable appearance of the stomach. Unremarkable
appearance of small bowel. No evidence of obstruction. Normal
appendix. Colonic diverticula without associated inflammatory
changes.

Lymphatic: No adenopathy.

Mesenteric: No free fluid or air. No mesenteric adenopathy.

Reproductive: Prostate measures 5.3 cm in greatest diameter

Other: Bilateral inguinal hernia, containing fat on the right and
the left bladder apex on the left.

Musculoskeletal: No evidence of acute fracture. No bony canal
narrowing. No significant degenerative changes of the hips.
IMPRESSION: Interval MARIO RIBEIRO for repair of infrarenal abdominal aortic aneurysm,
with right hypogastric artery coiling. A type 2 endoleak is present
via the lumbar arteries on this initial follow-up, with no change in
the size of the excluded aneurysm sac.

Additional ancillary findings as above.

## 2022-05-01 ENCOUNTER — Ambulatory Visit (INDEPENDENT_AMBULATORY_CARE_PROVIDER_SITE_OTHER): Payer: 59 | Admitting: Surgery

## 2022-05-01 ENCOUNTER — Other Ambulatory Visit: Payer: Self-pay | Admitting: Cardiology

## 2022-05-01 ENCOUNTER — Encounter: Payer: Self-pay | Admitting: Surgery

## 2022-05-01 DIAGNOSIS — I7143 Infrarenal abdominal aortic aneurysm, without rupture: Secondary | ICD-10-CM | POA: Diagnosis not present

## 2022-05-01 NOTE — Progress Notes (Signed)
Vascular and Vein Specialist of [facility]  Patient name: Lawrence Sandoval MRN: [id number] DOB: [birth year] Sex: male      Virtual Visit via Telephone Note   Because of Ericsson Sciortino co-morbid illnesses, he is at least at moderate risk for complications without adequate follow up.  This format is felt to be most appropriate for this patient at this time.  The patient did not have access to video technology/had technical difficulties with video requiring transitioning to audio format only (telephone).  All issues noted in this document were discussed and addressed.  No physical exam could be performed with this format.    Patient Location: Home Provider Location: Office/Clinic  REASON FOR APPOINTMENT:    Follow up  HISTORY OF PRESENT ILLNESS:   Lawrence Sandoval is a 67 y.o. male, who is status post endovascular pair of a infrarenal abdominal aortic aneurysm on 06/06/2019.  He also had coil embolization of the right internal iliac artery.  Most recently, there was an increase in size of his aneurysm sac and so he was sent for CT scan for further evaluation.  He continues to take a statin for hypercholesterolemia.  He is medically managed for hypertension.  He is a former smoker   PAST MEDICAL HISTORY    Past Medical History:  Diagnosis Date   AAA (abdominal aortic aneurysm) ([place])    Herniated lumbar intervertebral disc    Hyperlipidemia    Hypertension    Hyperthyroidism    Lumbago    Sleep apnea      FAMILY HISTORY   Family History  Problem Relation Age of Onset   Heart disease Mother        CHF   Alcohol abuse Father    Hypertension Father    Stroke Maternal Grandfather     SOCIAL HISTORY:   Social History   Socioeconomic History   Marital status: Unknown    Spouse name: Not on file   Number of children: Not on file   Years of education: Not on file   Highest education level: Not on file  Occupational History   Not on file  Tobacco Use   Smoking  status: Former    Years: 35.00    Types: Cigarettes    Quit date: 02/18/2003    Years since quitting: 19.2    Passive exposure: Never   Smokeless tobacco: Never  Vaping Use   Vaping Use: Never used  Substance and Sexual Activity   Alcohol use: No    Comment: quit abusing alcohol about 10 years ago   Drug use: No   Sexual activity: Not on file  Other Topics Concern   Not on file  Social History Narrative   Not on file   Social Determinants of Health   Financial Resource Strain: Not on file  Food Insecurity: Not on file  Transportation Needs: Not on file  Physical Activity: Not on file  Stress: Not on file  Social Connections: Not on file  Intimate Partner Violence: Not on file    ALLERGIES:    Allergies  Allergen Reactions   Gabapentin Nausea And Vomiting    headache    CURRENT MEDICATIONS:    Current Outpatient Medications  Medication Sig Dispense Refill   acetaminophen (TYLENOL) 500 MG tablet Take 1,000 mg by mouth every 6 (six) hours as needed for mild pain.     aspirin EC 81 MG tablet Take 1 tablet (81 mg  total) by mouth daily.     carvedilol (COREG) 12.5 MG tablet Take 1 tablet (12.5 mg total) by mouth 2 (two) times daily with a meal. 180 tablet 3   chlorthalidone (HYGROTON) 25 MG tablet Take 0.5 tablets (12.5 mg total) by mouth daily. 45 tablet 3   lidocaine (LIDODERM) 5 % 2 patches daily.     Loratadine-Pseudoephedrine (CLARITIN-D 24 HOUR PO) Take by mouth as needed.     methimazole (TAPAZOLE) 5 MG tablet Take 5 mg by mouth every other day.     Multiple Vitamin (QUINTABS) TABS Take 1 tablet by mouth daily.     omeprazole (PRILOSEC) 40 MG capsule Take 40 mg by mouth daily.      oxyCODONE (OXY IR/ROXICODONE) 5 MG immediate release tablet Take 5 mg by mouth 2 (two) times daily as needed.     pravastatin (PRAVACHOL) 40 MG tablet Take 40 mg by mouth at bedtime.      Pseudoeph-Doxylamine-DM-APAP (NYQUIL MULTI-SYMPTOM PO) Take 30 mLs by mouth at bedtime as needed  (Cold).     No current facility-administered medications for this visit.    REVIEW OF SYSTEMS:   Please see the history of present illness.     All other systems reviewed and are negative.  PHYSICAL EXAM:   Deferred due to virtual visit  Recent Labs: No results found for requested labs within last 365 days.   Recent Lipid Panel No results found for: "CHOL", "TRIG", "HDL", "CHOLHDL", "LDLCALC", "LDLDIRECT"  Wt Readings from Last 3 Encounters:  03/31/22 203 lb 12.8 oz (92.4 kg)  06/06/21 197 lb (89.4 kg)  05/10/21 201 lb 9.6 oz (91.4 kg)     STUDIES:   I have reviewed the following CT VASCULAR   Bilobed infrarenal abdominal aortic aneurysm status post aortobi-iliac endograft repair and coil embolization of the right internal iliac artery. 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 Interval bilobed aneurysm sac enlargement from 5.3 to 5.7 cm proximally and 5.0 to 5.5 cm distally.   NON-VASCULAR   1. No acute abdominopelvic abnormality. 2. Sigmoid diverticulosis. 3. Similar appearing tenting of the left anterior aspect of the bladder wall into a left inguinal hernia. 4. Prostatomegaly.   ASSESSMENT and PLAN   AAA: The patient is undergone successful endovascular repair in 2021.  He has had a slight increase in the size of his aneurysm sac with maximum diameter 5.8 cm today.  This represents about a 4-5 mm growth.  We discussed the significance of this and the treatment options.  We have decided to repeat imaging in 6 months.  If the aneurysm has again increased in size, I will refer him to interventional radiology for embolization    Time:   Today, I have spent 11 minutes with the patient with telehealth technology discussing the above problems.      Leia Alf, MD, FACS [facility] Specialists [facility] [phone] Pager [phone]

## 2022-05-01 NOTE — Telephone Encounter (Signed)
Rx request sent to pharmacy.  

## 2022-06-22 ENCOUNTER — Ambulatory Visit (HOSPITAL_BASED_OUTPATIENT_CLINIC_OR_DEPARTMENT_OTHER): Payer: 59 | Admitting: Cardiology

## 2022-09-28 ENCOUNTER — Other Ambulatory Visit: Payer: Self-pay

## 2022-09-28 DIAGNOSIS — I7143 Infrarenal abdominal aortic aneurysm, without rupture: Secondary | ICD-10-CM

## 2022-10-13 ENCOUNTER — Other Ambulatory Visit: Payer: 59

## 2022-10-30 ENCOUNTER — Ambulatory Visit: Payer: 59 | Admitting: Surgery

## 2022-11-30 ENCOUNTER — Other Ambulatory Visit: Payer: Self-pay | Admitting: Physician Assistant

## 2022-11-30 DIAGNOSIS — Z8679 Personal history of other diseases of the circulatory system: Secondary | ICD-10-CM

## 2022-12-06 ENCOUNTER — Ambulatory Visit
Admission: RE | Admit: 2022-12-06 | Discharge: 2022-12-06 | Disposition: A | Discharge status: Home / Self Care | Payer: 59 | Source: Ambulatory Visit | Attending: Physician Assistant | Admitting: Physician Assistant

## 2022-12-06 DIAGNOSIS — Z8679 Personal history of other diseases of the circulatory system: Secondary | ICD-10-CM

## 2022-12-06 HISTORY — PX: IR RADIOLOGIST EVAL & MGMT: IMG5224

## 2022-12-06 NOTE — Consult Note (Signed)
Chief Complaint: Patient was seen in consultation today for type II endoleak  Referring Physician(s): Marylene Buerger, MD  History of Present Illness: Lawrence Sandoval is a 67 y.o. 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 Coral Else.  At the time of the operation, the aortic aneurysm sac measured up to 5.0 cm.  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  He remains asymptomatic.  No back pain.  He had been following up with Dr. Myra Gianotti until recently converting his care to the [facility] system.    Past Medical History:  Diagnosis Date   AAA (abdominal aortic aneurysm) ([facility])    Herniated lumbar intervertebral disc    Hyperlipidemia    Hypertension    Hyperthyroidism    Lumbago    Sleep apnea     Past Surgical History:  Procedure Laterality Date   ABDOMINAL AORTIC ENDOVASCULAR STENT GRAFT N/A 06/06/2019   Procedure: ABDOMINAL AORTIC ENDOVASCULAR STENT GRAFT COIL WITH RIGHT Hypogastric artery;  Surgeon: Nada Libman, MD;  Location: [facility] OR;  Service: Vascular;  Laterality: N/A;   HAND SURGERY Left 1995   Hand and Arm injury.    HERNIA REPAIR     IR US GUIDE VASC ACCESS RIGHT  04/03/2019   IR US GUIDE VASC ACCESS RIGHT  07/02/2019   ULTRASOUND GUIDANCE FOR VASCULAR ACCESS Bilateral 06/06/2019   Procedure: Ultrasound Guidance For Vascular Access, bilateral femoral arteries;  Surgeon: Nada Libman, MD;  Location: [facility] OR;  Service: Vascular;  Laterality: Bilateral;    Allergies: Gabapentin  Medications: Prior to Admission medications   Medication Sig Start Date End Date Taking? Authorizing Provider  acetaminophen (TYLENOL) 500 MG tablet Take 1,000 mg by mouth every 6 (six) hours as needed for mild pain.    [provider]  aspirin EC 81 MG tablet Take 1 tablet (81 mg total) by mouth daily. 06/07/19   Lars Mage, PA-C  carvedilol (COREG) 12.5 MG tablet Take 1 tablet (12.5 mg total) by mouth 2 (two) times daily with a meal. 05/10/21   Jodelle Red, MD  chlorthalidone (HYGROTON) 25 MG tablet Take 0.5 tablets (12.5 mg total) by mouth daily. Please schedule appointment for refills. 05/01/22   Jodelle Red, MD  lidocaine (LIDODERM) 5 % 2 patches daily. 04/29/21   [provider]  Loratadine-Pseudoephedrine (CLARITIN-D 24 HOUR PO) Take by mouth as needed.    [provider]  methimazole (TAPAZOLE) 5 MG tablet Take 5 mg by mouth every other day.    [provider]  Multiple Vitamin (QUINTABS) TABS Take 1 tablet by mouth daily.    [provider]  omeprazole (PRILOSEC) 40 MG capsule Take 40 mg by mouth daily.  04/16/17   [provider]  oxyCODONE (OXY IR/ROXICODONE) 5 MG immediate release tablet Take 5 mg by mouth 2 (two) times daily as needed. 03/16/22   [provider]  pravastatin (PRAVACHOL) 40 MG tablet Take 40 mg by mouth at bedtime.     [provider]  Pseudoeph-Doxylamine-DM-APAP (NYQUIL MULTI-SYMPTOM PO) Take 30 mLs by mouth at bedtime as needed (Cold).    [provider]     Family History  Problem Relation Age of Onset   Heart disease Mother  CHF   Alcohol abuse Father    Hypertension Father    Stroke Maternal Grandfather     Social History   Socioeconomic History   Marital status: Unknown    Spouse name: Not on file   Number of children: Not on file   Years of education: Not on file   Highest education level: Not on file  Occupational History   Not on file  Tobacco Use   Smoking status: Former    Current packs/day: 0.00    Types: Cigarettes    Start date: 02/18/1968    Quit date: 02/18/2003    Years since quitting: 19.8    Passive exposure: Never   Smokeless tobacco: Never  Vaping Use    Vaping status: Never Used  Substance and Sexual Activity   Alcohol use: No    Comment: quit abusing alcohol about 10 years ago   Drug use: No   Sexual activity: Not on file  Other Topics Concern   Not on file  Social History Narrative   Not on file   Social Determinants of Health   Financial Resource Strain: Not on file  Food Insecurity: Low Risk  (08/31/2022)   Received from [facility]   Hunger Vital Sign    Worried About Running Out of Food in the Last Year: Never true    Ran Out of Food in the Last Year: Never true  Transportation Needs: Not on file (08/31/2022)  Physical Activity: Not on file  Stress: Not on file  Social Connections: Not on file   Review of Systems: A 12 point ROS discussed and pertinent positives are indicated in the HPI above.  All other systems are negative.  Vital Signs: There were no vitals taken for this visit.  Advance Care Plan: The advanced care plan/surrogate decision maker was discussed at the time of visit and documented in the medical record.   No physical examination was performed in lieu of virtual telephone clinic visit.  Imaging: CTA AP   Type II endoleak arising from bilateral L4 lumbar arteries, with prominent left iliolumbar artery arising from the proximal internal iliac artery.  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    Labs:  CBC: No results for input(s): "WBC", "HGB", "HCT", "PLT" in the last 8760 hours.  COAGS: No results for input(s): "INR", "APTT" in the last 8760 hours.  BMP: No results for input(s): "NA", "K", "CL", "CO2", "GLUCOSE", "BUN", "CALCIUM", "CREATININE", "GFRNONAA", "GFRAA" in the last 8760 hours.  Invalid input(s): "CMP"  LIVER FUNCTION TESTS: No results for input(s): "BILITOT", "AST", "ALT", "ALKPHOS", "PROT", "ALBUMIN" in the last 8760 hours.  TUMOR MARKERS: No results for input(s): "AFPTM", "CEA", "CA199", "CHROMGRNA" in the last 8760 hours.  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  He remains asymptomatic.  The endoleak is approachable via the left iliolumbar artery.  We discussed rationale for endoleak repair, the basic steps and periprocedural expectations, in addition to risks and benefits.  He is amenable and wants to proceed.  Plan for transarterial endoleak repair at [facility] with moderate sedation.  Plan for left common femoral artery access.     Electronically Signed: Bennie Dallas,  MD 12/06/2022, 12:33 PM   I spent a total of  40 Minutes  in virtual telephone clinical consultation, greater than 50% of which was counseling/coordinating care for endoleak.

## 2023-01-29 ENCOUNTER — Other Ambulatory Visit: Payer: Self-pay | Admitting: Interventional Radiology

## 2023-01-29 DIAGNOSIS — I9789 Other postprocedural complications and disorders of the circulatory system, not elsewhere classified: Secondary | ICD-10-CM

## 2023-01-31 NOTE — Progress Notes (Signed)
Reason for follow up: The patient is seen in virtual telephone follow up today s/p endoleak repair 01/11/23 at [facility]  Referring Physician(s): Marylene Buerger, MD   History of present illness: HPI from initial consult 12/06/22 Lawrence Sandoval is a 67 y.o. 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 Coral Else.  At the time of the operation, the aortic aneurysm sac measured up to 5.0 cm.  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  He remains asymptomatic.  No back pain.  He had been following up with Dr. Myra Gianotti until recently converting his care to the [facility] system.   We discussed the rationale for endoleak repair, the basic steps and periprocedural expectations in addition to the risks and benefits. He was agreeable to everything we discussed and he underwent a technically successful coil and liquid embolization of the endoleak 01/11/23 at [facility]. He tolerated the procedure well and was discharged home the same day.  He presents today to the IR outpatient clinic for follow up.   He reports doing very well since the procedure. No left groin or lower extremity pain.  No abdominal pain.  He has gone back to work.    Past Medical History:  Diagnosis Date   AAA (abdominal aortic aneurysm) ([facility])    Herniated lumbar intervertebral disc    Hyperlipidemia    Hypertension    Hyperthyroidism    Lumbago    Sleep apnea     Past Surgical History:  Procedure Laterality Date   ABDOMINAL AORTIC ENDOVASCULAR STENT GRAFT N/A 06/06/2019   Procedure: ABDOMINAL AORTIC ENDOVASCULAR STENT GRAFT COIL WITH RIGHT Hypogastric artery;  Surgeon: Nada Libman, MD;  Location: [facility] OR;  Service: Vascular;  Laterality: N/A;   HAND  SURGERY Left 1995   Hand and Arm injury.    HERNIA REPAIR     IR RADIOLOGIST EVAL & MGMT  12/06/2022   IR US GUIDE VASC ACCESS RIGHT  04/03/2019   IR US GUIDE VASC ACCESS RIGHT  07/02/2019   ULTRASOUND GUIDANCE FOR VASCULAR ACCESS Bilateral 06/06/2019   Procedure: Ultrasound Guidance For Vascular Access, bilateral femoral arteries;  Surgeon: Nada Libman, MD;  Location: [facility] OR;  Service: Vascular;  Laterality: Bilateral;    Allergies: Gabapentin  Medications: Prior to Admission medications   Medication Sig Start Date End Date Taking? Authorizing Provider  acetaminophen (TYLENOL) 500 MG tablet Take 1,000 mg by mouth every 6 (six) hours as needed for mild pain.    [provider]  aspirin EC 81 MG tablet Take 1 tablet (81 mg total) by mouth daily. 06/07/19   Lars Mage, PA-C  carvedilol (COREG) 12.5 MG tablet Take 1 tablet (12.5 mg total) by mouth 2 (two) times daily with a meal. 05/10/21   Jodelle Red, MD  chlorthalidone (HYGROTON) 25 MG tablet Take 0.5 tablets (12.5 mg total) by mouth daily. Please schedule appointment for refills. 05/01/22   Jodelle Red, MD  lidocaine (LIDODERM) 5 % 2 patches daily. 04/29/21   [provider]  Loratadine-Pseudoephedrine (CLARITIN-D 24 HOUR PO) Take by mouth as needed.    [provider]  methimazole (TAPAZOLE) 5 MG tablet Take 5 mg by mouth every other day.    [provider]  Multiple Vitamin (QUINTABS) TABS Take 1 tablet by mouth daily.    [provider]  omeprazole (PRILOSEC) 40 MG capsule Take 40 mg by mouth daily.  04/16/17   [provider]  oxyCODONE (OXY IR/ROXICODONE) 5 MG immediate release tablet Take 5 mg by mouth 2 (two) times daily as needed. 03/16/22   [provider]  pravastatin (PRAVACHOL) 40 MG tablet Take 40 mg by mouth at bedtime.     [provider]  Pseudoeph-Doxylamine-DM-APAP (NYQUIL MULTI-SYMPTOM PO) Take 30 mLs by mouth at bedtime as  needed (Cold).    [provider]     Family History  Problem Relation Age of Onset   Heart disease Mother        CHF   Alcohol abuse Father    Hypertension Father    Stroke Maternal Grandfather     Social History   Socioeconomic History   Marital status: Unknown    Spouse name: Not on file   Number of children: Not on file   Years of education: Not on file   Highest education level: Not on file  Occupational History   Not on file  Tobacco Use   Smoking status: Former    Current packs/day: 0.00    Types: Cigarettes    Start date: 02/18/1968    Quit date: 02/18/2003    Years since quitting: 19.9    Passive exposure: Never   Smokeless tobacco: Never  Vaping Use   Vaping status: Never Used  Substance and Sexual Activity   Alcohol use: No    Comment: quit abusing alcohol about 10 years ago   Drug use: No   Sexual activity: Not on file  Other Topics Concern   Not on file  Social History Narrative   Not on file   Social Determinants of Health   Financial Resource Strain: Not on file  Food Insecurity: Low Risk  (08/31/2022)   Received from [facility]   Hunger Vital Sign    Worried About Running Out of Food in the Last Year: Never true    Ran Out of Food in the Last Year: Never true  Transportation Needs: Not on file (08/31/2022)  Physical Activity: Not on file  Stress: Not on file  Social Connections: Not on file     Vital Signs: There were no vitals taken for this visit.  No physical exam was performed in lieu of virtual telephone visit.    Imaging: CTA AP   Type II endoleak arising from bilateral L4 lumbar arteries, with prominent left iliolumbar artery arising from the proximal internal iliac artery.  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    IR endoleak repair (01/11/23)      Labs:  CBC: No results for input(s): "WBC", "HGB", "HCT", "PLT" in the last 8760 hours.  COAGS: No results for input(s): "INR", "APTT" in the last  8760 hours.  BMP: No results for input(s): "NA", "K", "CL", "CO2", "GLUCOSE", "BUN", "CALCIUM", "CREATININE", "GFRNONAA", "GFRAA" in the last 8760 hours.  Invalid input(s): "CMP"  LIVER FUNCTION TESTS: No results for input(s): "BILITOT", "AST", "ALT", "ALKPHOS", "PROT", "ALBUMIN" in the last 8760 hours.  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  He underwent a successful coil and liquid embolization of the endoleak 01/11/23 at [facility].  He has recovered well.  Plan for triple phase CTA abdomen pelvis (stent graft protocol) in 2 months, with IR clinic follow up shortly thereafter.  Marliss Coots, MD Pager: [phone]    I spent a total of 25 Minutes in virtual telephoneclinical consultation, greater than 50% of which was counseling/coordinating care for endoleak.

## 2023-02-01 ENCOUNTER — Other Ambulatory Visit: Payer: 59

## 2023-02-01 ENCOUNTER — Ambulatory Visit
Admission: RE | Admit: 2023-02-01 | Discharge: 2023-02-01 | Disposition: A | Discharge status: Home / Self Care | Payer: 59 | Source: Ambulatory Visit | Attending: Interventional Radiology | Admitting: Interventional Radiology

## 2023-02-01 DIAGNOSIS — I9789 Other postprocedural complications and disorders of the circulatory system, not elsewhere classified: Secondary | ICD-10-CM

## 2023-02-01 HISTORY — PX: IR RADIOLOGIST EVAL & MGMT: IMG5224

## 2023-05-29 ENCOUNTER — Other Ambulatory Visit: Payer: Self-pay | Admitting: Interventional Radiology

## 2023-05-29 DIAGNOSIS — I7143 Infrarenal abdominal aortic aneurysm, without rupture: Secondary | ICD-10-CM

## 2023-06-11 ENCOUNTER — Ambulatory Visit (HOSPITAL_BASED_OUTPATIENT_CLINIC_OR_DEPARTMENT_OTHER)

## 2023-06-14 NOTE — Progress Notes (Signed)
 This encounter was conducted via the [employer] providing interactive audio and visual communication.  The patient provided verbal consent to conduct a virtual appointment.  The patient was located at their primary residence during this encounter.   Reason for follow up: The patient is seen in virtual video follow up today s/p endoleak repair 01/11/23 at [facility]   Referring Physician(s): Octavia Belton, MD   History of present illness: HPI from last clinic visit 02/01/23 Lawrence Tumolo is a 68 y.o. 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 Genny Kid.  At the time of the operation, the aortic aneurysm sac measured up to 5.0 cm.  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  He remains asymptomatic.  No back pain.  He had been following up with Dr. Charlotte Cookey until recently converting his care to the [facility] system.    We discussed the rationale for endoleak repair, the basic steps and periprocedural expectations in addition to the risks and benefits. He was agreeable to everything we discussed and he underwent a technically successful coil and liquid embolization of the endoleak 01/11/23 at [facility]. He tolerated the procedure well and was discharged home the same day.  He presents today to the IR outpatient clinic for follow up.    He reports doing very well since the procedure. No left groin or lower extremity pain.  No abdominal pain.  He had gone back to work. We discussed obtaining a triple phase CTA abdomen/pelvis (stent graft protocol) in two months with clinic visit shortly thereafter.   He presents today via virtual video visit for follow up. Since our last visit he has been to the OR for an incarcerated  sliding left inguinal hernia and ventral hernia and is s/p robotic repair 03/30/23.   Past Medical History:  Diagnosis Date   AAA (abdominal aortic aneurysm) (HCC)    Herniated lumbar intervertebral disc    Hyperlipidemia    Hypertension    Hyperthyroidism    Lumbago    Sleep apnea     Past Surgical History:  Procedure Laterality Date   ABDOMINAL AORTIC ENDOVASCULAR STENT GRAFT N/A 06/06/2019   Procedure: ABDOMINAL AORTIC ENDOVASCULAR STENT GRAFT COIL WITH RIGHT Hypogastric artery;  Surgeon: Margherita Shell, MD;  Location: [facility] OR;  Service: Vascular;  Laterality: N/A;   HAND SURGERY Left 1995   Hand and Arm injury.    HERNIA REPAIR     IR RADIOLOGIST EVAL & MGMT  12/06/2022   IR RADIOLOGIST EVAL & MGMT  02/01/2023   IR US  GUIDE VASC ACCESS RIGHT  04/03/2019   IR US  GUIDE VASC ACCESS RIGHT  07/02/2019   ULTRASOUND GUIDANCE FOR VASCULAR ACCESS Bilateral 06/06/2019   Procedure: Ultrasound Guidance For Vascular Access, bilateral femoral arteries;  Surgeon: Margherita Shell, MD;  Location: [facility] OR;  Service: Vascular;  Laterality: Bilateral;    Allergies: Gabapentin  Medications: Prior to Admission medications   Medication Sig Start Date End Date Taking? Authorizing Provider  acetaminophen  (TYLENOL ) 500 MG tablet Take 1,000 mg by mouth every 6 (six) hours as needed for mild pain.    [provider]  aspirin  EC 81 MG tablet Take 1 tablet (81 mg total) by mouth daily. 06/07/19  Arnaldo Betters M, PA-C  carvedilol  (COREG ) 12.5 MG tablet Take 1 tablet (12.5 mg total) by mouth 2 (two) times daily with a meal. 05/10/21   Sheryle Donning, MD  chlorthalidone  (HYGROTON ) 25 MG tablet Take 0.5 tablets (12.5 mg total) by mouth daily. Please schedule appointment for refills. 05/01/22   Sheryle Donning, MD  lidocaine  (LIDODERM ) 5 % 2 patches daily. 04/29/21   [provider]  Loratadine-Pseudoephedrine (CLARITIN-D 24 HOUR PO) Take by mouth as needed.    [provider]   methimazole  (TAPAZOLE ) 5 MG tablet Take 5 mg by mouth every other day.    [provider]  Multiple Vitamin (QUINTABS) TABS Take 1 tablet by mouth daily.    [provider]  omeprazole (PRILOSEC) 40 MG capsule Take 40 mg by mouth daily.  04/16/17   [provider]  oxyCODONE  (OXY IR/ROXICODONE ) 5 MG immediate release tablet Take 5 mg by mouth 2 (two) times daily as needed. 03/16/22   [provider]  pravastatin  (PRAVACHOL ) 40 MG tablet Take 40 mg by mouth at bedtime.     [provider]  Pseudoeph-Doxylamine-DM-APAP (NYQUIL MULTI-SYMPTOM PO) Take 30 mLs by mouth at bedtime as needed (Cold).    [provider]     Family History  Problem Relation Age of Onset   Heart disease Mother        CHF   Alcohol abuse Father    Hypertension Father    Stroke Maternal Grandfather     Social History   Socioeconomic History   Marital status: Unknown    Spouse name: Not on file   Number of children: Not on file   Years of education: Not on file   Highest education level: Not on file  Occupational History   Not on file  Tobacco Use   Smoking status: Former    Current packs/day: 0.00    Types: Cigarettes    Start date: 02/18/1968    Quit date: 02/18/2003    Years since quitting: 20.3    Passive exposure: Never   Smokeless tobacco: Never  Vaping Use   Vaping status: Never Used  Substance and Sexual Activity   Alcohol use: No    Comment: quit abusing alcohol about 10 years ago   Drug use: No   Sexual activity: Not on file  Other Topics Concern   Not on file  Social History Narrative   Not on file   Social Drivers of Health   Financial Resource Strain: Not on file  Food Insecurity: Low Risk  (08/31/2022)   Received from [facility]   Hunger Vital Sign    Worried About Running Out of Food in the Last Year: Never true    Ran Out of Food in the Last Year: Never true  Transportation Needs: Not on file (08/31/2022)  Physical  Activity: Not on file  Stress: Not on file  Social Connections: Not on file     Vital Signs: There were no vitals taken for this visit.  Physical Exam Patient is alert, oriented and able to participate fully in the conversation. No apparent discomfort or distress observed. He appears appropriately dressed.   Imaging:  CTA AP   Type II endoleak arising from bilateral L4 lumbar arteries, with prominent left iliolumbar artery arising from the proximal internal iliac artery.  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     IR endoleak repair (01/11/23)     CTA AP 06/16/23  Probable persistent small endoleak in superior  aspect, stable aneurysm sac size at 5.8 cm.  Study lacking non-contrast phase.  Labs:  CBC: No results for input(s): "WBC", "HGB", "HCT", "PLT" in the last 8760 hours.  COAGS: No results for input(s): "INR", "APTT" in the last 8760 hours.  BMP: No results for input(s): "NA", "K", "CL", "CO2", "GLUCOSE", "BUN", "CALCIUM", "CREATININE", "GFRNONAA", "GFRAA" in the last 8760 hours.  Invalid input(s): "CMP"  LIVER FUNCTION TESTS: No results for input(s): "BILITOT", "AST", "ALT", "ALKPHOS", "PROT", "ALBUMIN" in the last 8760 hours.  Assessment and Plan: 68 year old male with history of abdominal aortic and right internal iliac artery aneurysms status post aortobiiliac endograft repair and right internal iliac artery coiling in 2021 with gradual persistent aneurysm sac enlargement over the past 4 years to 5.0 cm from 5.8 cm due to a type II endoleak arising from the L4 vertebral arteries. He underwent a successful coil and liquid embolization of the endoleak 01/11/23 at [facility]. Follow up CTA demonstrates stable aneurysm sac size with probable small persistent endoleak in the superior aspect of the aneurysm.  He remains asymptomatic.  Plan for CTA abdomen pelvis (EVAR/stent protocol) in 6 months with IR clinic follow up shortly thereafter.  Creasie Doctor,  MD Pager: [phone]    I spent a total of 25 Minutes in virtual video clinical consultation, greater than 50% of which was counseling/coordinating care for endoleak

## 2023-06-16 ENCOUNTER — Ambulatory Visit (HOSPITAL_BASED_OUTPATIENT_CLINIC_OR_DEPARTMENT_OTHER)
Admission: RE | Admit: 2023-06-16 | Discharge: 2023-06-16 | Disposition: A | Discharge status: Home / Self Care | Source: Ambulatory Visit | Attending: Interventional Radiology | Admitting: Interventional Radiology

## 2023-06-16 DIAGNOSIS — I7143 Infrarenal abdominal aortic aneurysm, without rupture: Secondary | ICD-10-CM | POA: Diagnosis present

## 2023-06-16 MED ORDER — IOHEXOL 350 MG/ML SOLN
100.0000 mL | Freq: Once | INTRAVENOUS | Status: AC | PRN
  Administered 2023-06-16: 100 mL via INTRAVENOUS

## 2023-06-18 ENCOUNTER — Ambulatory Visit
Admission: RE | Admit: 2023-06-18 | Discharge: 2023-06-18 | Disposition: A | Discharge status: Home / Self Care | Source: Ambulatory Visit | Attending: Interventional Radiology

## 2023-06-18 ENCOUNTER — Ambulatory Visit (HOSPITAL_BASED_OUTPATIENT_CLINIC_OR_DEPARTMENT_OTHER)

## 2023-06-18 DIAGNOSIS — I7143 Infrarenal abdominal aortic aneurysm, without rupture: Secondary | ICD-10-CM

## 2023-06-18 HISTORY — PX: IR RADIOLOGIST EVAL & MGMT: IMG5224

## 2023-12-13 ENCOUNTER — Other Ambulatory Visit: Payer: Self-pay | Admitting: Interventional Radiology

## 2023-12-13 DIAGNOSIS — I9789 Other postprocedural complications and disorders of the circulatory system, not elsewhere classified: Secondary | ICD-10-CM

## 2023-12-31 ENCOUNTER — Telehealth (HOSPITAL_BASED_OUTPATIENT_CLINIC_OR_DEPARTMENT_OTHER): Payer: Self-pay

## 2023-12-31 ENCOUNTER — Ambulatory Visit (HOSPITAL_BASED_OUTPATIENT_CLINIC_OR_DEPARTMENT_OTHER): Admission: RE | Admit: 2023-12-31 | Source: Ambulatory Visit

## 2024-01-01 NOTE — Discharge Summary (Signed)
 I agree with the findings and plan  Rosalynn CHARM Dines, M.D.Cardiothoracic Surgery Discharge Summary Tue 01/01/2024   Patient:  Lawrence Sandoval  MRN: [id number] DOB: 20-Aug-1955 Age: 68 y.o.  AdmittingAttending: Zoaib Safdar Rasool, DO  Admission Date: 12/25/2023 Admission Diagnosis: Chest pain [R07.9] Elevated troponin [R79.89] Chest pain, unspecified type [R07.9]  Discharge Service: Cardiovascular Surgery Discharge Attending: Rosalynn Dines, MD Discharge Date: January 01, 2024 Discharge Condition: Stable  Hospital Course: On 12/27/2023 Lawrence Sandoval was taken to the OR for the following procedure: CABG x 2. LIMA to the mid LAD, saphenous vein graft to OM 2 via sternotomy.  He had been admitted with a non-STEMI.  Postoperatively, overall the patient did well.  He was extubated on the day of surgery.  He is seen by physical therapy on the first postoperative day and ambulated.  His chest tubes were not removed until his third postoperative day because of a moderate amount of drainage.  By time of discharge he was ambulating without difficulty.  His wounds are healing nicely with no evidence of infection.  He is eating a regular diet without any complaints of nausea or vomiting.  He was started on a beta-blocker, statin, aspirin , Plavix.  His blood pressure systolics were a little bit soft in the 99-112 range.  He was tolerating the beta-blocker.  His oxygen was weaned off into room air saturations greater than 92%.  His final chest x-ray showed very small bilateral pleural effusions was otherwise clear.  By his fifth postoperative day he was in stable condition ready for discharge.  We discharged the patient in stable condition.   Physical Exam: Vital Signs: Temp:  [97.8 F (36.6 C)-98.5 F (36.9 C)] 98 F (36.7 C) Heart Rate:  [95-103] 95 Resp:  [15-19] 18 BP: (99-112)/(68-75) 102/68  Current Weight: 85.4 kg (188 lb 4.4 oz)  Admission Weight: Weight: 81.6 kg (180 lb)  General: No apparent  distress. Alert,oriented X 4.  Cardiovascular: Regular rate and rhythm, s1, s2, no murmurs.  Chest/Lungs: Clear to auscultation bilaterally, no wheezes or rhonchi.  Abdomen: Positive bowel sounds. Abdomen soft, nondistended, nontender. NoHSM, rebound or guarding.  : 2+ peripheral pulses.  No clubbing, cyanosis. No edema.   Skin / Incision / Wound: Sternal incision is C/D/I, chest tube site C/D/I SVG incision C/D/I       Labs:  Results from last 7 days  Lab Units 01/01/24 0413 12/31/23 0332 12/30/23 0413  WHITE BLOOD CELL COUNT 10*3/uL 7.53 7.52 7.12  HEMOGLOBIN g/dL 8.2* 7.9* 8.5*  HEMATOCRIT % 23.9* 23.4* 25.2*  PLATELET COUNT 10*3/uL 233 197 175   Results from last 7 days  Lab Units 01/01/24 0413 12/31/23 0332 12/30/23 0413  SODIUM mmol/L 134* 132* 134*  POTASSIUM mmol/L 4.2 3.9 3.9  CHLORIDE mmol/L 100 99 101  CO2 mmol/L 30 29 26   BUN mg/dL 10 16 12   CREATININE mg/dL 9.14 9.02 9.17  GLUCOSE mg/dL 886* 873* 876*  CALCIUM mg/dL 8.9 8.6 8.2*   Results from last 7 days  Lab Units 01/01/24 0413 12/31/23 0332 12/30/23 0413  MAGNESIUM  mg/dL 1.7* 1.7* 1.9    Results from last 7 days  Lab Units 12/27/23 1537 12/26/23 1653 12/26/23 0924  INR  1.4 1.0 1.0   No results for input(s): HGBA1C in the last 72 hours.  Imaging: XR Chest 1 View Result Date: 12/31/2023 XR CHEST 1 VIEW, 12/31/2023 12:33 PM INDICATION:d/c wires COMPARISON: Chest x-ray on 12/30/2023. FINDINGS: Devices: Interval removal of the mediastinal drain. Cardiovascular/lungs/pleura: Cardiac silhouette  and pulmonary vasculature are within normal limits. Bilateral small pleural effusion. Retrocardiac opacity with air bronchogram. Other: Sternotomy clips..   1.  Interval removal of the mediastinal drain. No pneumothorax. 2.  Similar retrocardiac opacity can represent focal consolidation/atelectasis.  ECG 12 lead Result Date: 12/31/2023 Ventricular Rate                   95        BPM                 Atrial  Rate                        95        BPM                 P-R Interval                       184       ms                  QRS Duration                       88        ms                  Q-T Interval                       326       ms                  QTC Calculation Bazett             409       ms                  Calculated P Axis                  35        degrees             Calculated R Axis                  -3        degrees             Calculated T Axis                  1         degrees             Sinus rhythm Inferior infarct , age undetermined T wave abnormality, consider anterior ischemia When compared with ECG of 27-Dec-2023 07 00, Premature ventricular complexes are no longer present T wave inversion now evident in anterior leads Confirmed by Rojelio Dunnings  [phone]  on 12-31-2023 12 38 21 PM  XR Chest 1 View Result Date: 12/31/2023 XR CHEST 1 VIEW, 12/30/2023 6:31 AM INDICATION:POD 3 Open [facility] Surgery COMPARISON: Chest radiograph 12/28/2023 FINDINGS: Supportive devices: Interval removal of Swan-Ganz catheter. Similar position of mediastinal-pleural drain and epicardial pacer wires. Cardiovascular/lungs/pleura: Similar postsurgical changes cardiac silhouette. Low lung volume with resultant accentuation of vascular markings. Linear opacity in the right lower lung, favored to subsegmental atelectasis. Similar heterogeneous consolidation in the left retrocardiac space. No pleural effusion or pneumothorax. Other: No interval osseous change.   *  Similar heterogeneous consolidation in the left retrocardiac  space, favored to aspiration versus atelectasis or combination of both. *  Interval removal of Swan-Ganz catheter. Remainder supportive devices are in similar position   XR Chest 1 View Result Date: 12/28/2023 XR CHEST 1 VIEW 12/28/2023 4:49 AM INDICATION: POD 1 [facility] COMPARISON: 12/27/2023 chest radiograph FINDINGS: .  Supportive devices: Interval extubation and removal of gastric  decompression tube. Right IJ approach Swan-Ganz catheter terminates over the interlobar pulmonary artery.  Mediastinal/pleural drains. Epicardial pacing wires. .  Cardiovascular/Mediastinum: Similar postsurgical changes of the cardiomediastinal silhouette. Similar orientation of median sternotomy wires. .  Lungs/Pleura: Low lung volumes. Similar left basilar opacities. No large pleural effusion. No discernible pneumothorax. .  Other: No acute abnormality of the visualized abdomen. No interval osseous change.   *  Interval extubation with stable postoperative cardiomediastinal silhouette and lung aeration. *  Right IJ approach Swan-Ganz catheter terminates over the interlobar pulmonary artery.   XR Chest 1 View Result Date: 12/27/2023 XR CHEST 1 VIEW, 12/27/2023 6:18 PM INDICATION:Postop Open Heart Surgery COMPARISON: Chest radiograph 12/25/2023 FINDINGS: Supportive devices: Endotracheal tube tip projects approximately 4 cm above the carina. Gastric decompression tube courses over midline and terminates outside the field-of-view. Right IJ approach pulmonary artery catheter tip projects over the right main pulmonary artery. Mediastinal and left pleural drains in place. Epicardial pacing wires. Cardiovascular/lungs/pleura: Cardiomediastinal silhouette is within normal limits. Status post CABG. Bibasilar atelectasis. No focal consolidation. No large effusion. No pneumothorax. Other: No acute osseous abnormality. Median sternotomy wires.   1.  Pulmonary artery catheter tip projects over the right main pulmonary artery. 2.  Endotracheal tube projects 4 cm above the carina. 3.  Postsurgical changes of CABG.   US  Anesthesiology Images Result Date: 12/27/2023 This order was created for film storage, please see performing providers notes for details.  ECG 12 lead Result Date: 12/27/2023 Ventricular Rate                   78        BPM                 Atrial Rate                        78        BPM                  P-R Interval                       156       ms                  QRS Duration                       86        ms                  Q-T Interval                       344       ms                  QTC Calculation Bazett             392       ms                  Calculated  P Axis                  57        degrees             Calculated R Axis                  5         degrees             Calculated T Axis                  -9        degrees             Sinus rhythm Low voltage QRS, consider pulmonary disease or obesity Inferior infarct , age undetermined When compared with ECG of 26-Dec-2023 17 51, No significant change Confirmed by Rojelio Dunnings  [phone]  on 12-27-2023 8 08 12 AM  ECG 12 lead Result Date: 12/27/2023 Ventricular Rate                   75        BPM                 Atrial Rate                        75        BPM                 P-R Interval                       148       ms                  QRS Duration                       96        ms                  Q-T Interval                       370       ms                  QTC Calculation Bazett             413       ms                  Calculated P Axis                  40        degrees             Calculated R Axis                  -12       degrees             Calculated T Axis                  30        degrees             Sinus rhythm Low voltage QRS, consider pulmonary disease or obesity Nonspecific T wave changes When compared with ECG of 26-Dec-2023 14 54, Aberrant conduction is no  longer present Confirmed by Rojelio Dunnings  [phone]  on 12-27-2023 8 07 54 AM  ECG 12 lead Result Date: 12/27/2023 Ventricular Rate                   75        BPM                 Atrial Rate                        75        BPM                 P-R Interval                       180       ms                  QRS Duration                       96        ms                  Q-T Interval                       362       ms                  QTC Calculation Bazett              404       ms                  Calculated P Axis                  57        degrees             Calculated R Axis                  -16       degrees             Calculated T Axis                  4         degrees             Sinus rhythm with Premature atrial complexes with Aberrant conduction Possible Anterior infarct , age undetermined When compared with ECG of 26-Dec-2023 07 45, Aberrant conduction now present Confirmed by Rojelio Dunnings  [phone]  on 12-27-2023 8 07 26 AM  ECG 12 lead Result Date: 12/27/2023 Ventricular Rate                   73        BPM                 Atrial Rate                        73        BPM                 P-R Interval                       164  ms                  QRS Duration                       96        ms                  Q-T Interval                       374       ms                  QTC Calculation Bazett             412       ms                  Calculated P Axis                  42        degrees             Calculated R Axis                  -10       degrees             Calculated T Axis                  -5        degrees             Sinus rhythm with occasional Premature ventricular complexes Otherwise normal ECG When compared with ECG of 27-Dec-2023 00 56, Premature ventricular complexes are now present Confirmed by Rojelio Dunnings  [phone]  on 12-27-2023 8 04 17 AM  Transthoracic echo (TTE) complete Result Date: 12/26/2023                                                                                                     Version  1                                                                                                     Study ID  [id number]                                                                  +--------------------------------------------------+                           +----------+  [facility]                                                                                                                            [facility]                                       +--------------------------------------------------+                                                                                                                                           +----------+ [facility] [address] Transthoracic Echocardiogram Report Name  CIRO, TASHIRO                       Study Date  12-26-2023, 1  34 PM                 Height  72.01 in MRN  [id number]                                      Patient Location  [facility]                     Weight  179.602 lb DOB  [birth year]  [id number]                        Birth Gender  Male                               BSA  2.04 m Age  39 Years                                       Ethnicity  3                                    BP  118 - 91 mmHg Reason For Study  chest  pain Ordering Physician  RASOOL, ZOAIB SAFDAR Performed By  ETHEL DIXON Referring Physician  RASOOL, ZOAIB SAFDAR PROCEDURE A two-dimensional transthoracic echocardiogram with color flow and Doppler was performed. Definity echocontrast utilized for endocardial border enhancement. Image Quality  Technically difficult. The left ventricular size is normal. There is normal left ventricular wall thickness. There is basal LV inferior wall hypokinesis There is mid LV anterior septal hypokinesis LV ejection fraction = 40-45%. There is trace aortic regurgitation. There is trace tricuspid regurgitation. There is no pericardial effusion. IVC size is small and collapsing reflecting intravascular volume depletion. The ascending aorta is normal size. There is no comparison study available. LEFT VENTRICLE The left ventricular size is normal. There is normal left ventricular wall thickness. Left ventricular systolic function is mildly reduced. LV ejection fraction = 40-45%. There is basal  LV inferior wall hypokinesis. There is mid LV anterior septal hypokinesis. RIGHT VENTRICLE The right ventricle is mildly dilated. LEFT ATRIUM The left atrial size is normal. RIGHT ATRIUM Right atrial size is normal. AORTIC VALVE Structurally normal aortic valve. The aortic valve opens well. There is no aortic stenosis. There is trace aortic regurgitation. MITRAL VALVE The mitral valve leaflets appear normal. There is trace mitral regurgitation. TRICUSPID VALVE Structurally normal tricuspid valve. There is trace tricuspid regurgitation. There was insufficient TR detected to calculate RV systolic pressure. PULMONIC VALVE The pulmonic valve is not well visualized. There is no pulmonic valvular regurgitation. ARTERIES The aortic Sinus es  of Valsalva are borderline dilated. 3.8 cm. The ascending aorta is normal size. VENOUS Pulmonary venous flow pattern is normal. IVC size is small and collapsing reflecting intravascular volume depletion. EFFUSION There is no pericardial effusion. Misc There is no comparison study available. MMode-2D Measurements & Calculations asc Aorta Diam  3.5 cm                EDV MOD-sp2   121.0 ml                EDV MOD-sp4   99.1 ml   EF A4C  44.8 % ESV MOD-sp2   60.9 ml                 ESV MOD-sp4   54.7 ml                 IVSd  1.04 cm           LA dim  2.47 cm LA ESV Index  A2C   14.7 ml-m       LA ESV Index  A4C   10.4 ml-m        LVIDd  4.5 cm            LVIDs  3.3 cm LVOT diam  2.38 cm                     LVPWd  0.99 cm                        SV A4C  44.4 ml Doppler Measurements & Calculations Ao max PG  3.5 mmHg                    Ao mean PG  2.00 mmHg                 Ao V2 max  93.7 cm-sec Ao V2 mean  64.2 cm-sec Ao V2 VTI  18.5 cm  AS Dimensionless Index  VTI   0.90    AVAi VTI cm^2-m^2  1.98 cm           E-Lat E  3.6 E-Med E  7.4                          Lat Peak E  Vel  10.0 cm-sec          LV V1 VTI  16.7 cm      Med Peak E  Vel  4.8 cm-sec MV A max vel  59.5  cm-sec              MV dec time  0.26 sec                  MV E max vel  35.6 cm-sec               SV index LVOT   36.6 ml-m Other Measurements & Calculations AVA  VTI   4.0 cm                   BSA  2.04 m                          MV E-A  0.60             SI MOD-sp4   21.8 ml-m SV LVOT   74.6 ml                      SV MOD-sp4   44.4 ml ______________________________________________________________________________                                 MD Elspeth Lorrene Kitten, MD, [phone]        12-26-2023, 3  32 PM  Cardiac catheterization Result Date: 12/26/2023 .  2nd Mrg lesion is 70% stenosed. .  Prox LAD lesion is 95% stenosed. .  Mid RCA-1 lesion is 90% stenosed. .  Mid RCA-2 lesion is 99% stenosed. CONCLUSIONS:  1.  3 vessel CAD           95% proximal LAD, 70% OM2, subtotally occluded RCA with L-R and R-R collaterals. 2.  LVEDP 5 mmHg. 3.  RRA access. RECOMMENDATIONS: 1.  CTS consult to evaluation for possible CABG. 2.  Consider heparin  IV infusion if pt has more CP. 3.  GDMT for CAD. 4.  No antiplatelet agents except aspirin  for now. 5.  Identify and modify cardiac risk factors. 6.  Eventually refer to cardiac rehab. 7.  Echo to evaluate LV and valve function.    CT Angio Chest Abdomen Pelvis Result Date: 12/26/2023 CT ANGIO CHEST ABDOMEN PELVIS ([facility] ONLY), 12/25/2023 2:21 PM INDICATION:chest pain, hx of aortic repair COMPARISON: 11/24/2022 LIMITATIONS: Protocol has been optimized for aorta and its branches, with decreased sensitivity for detection of soft tissue pathology and other vessels. Non-gated technique limits cardiac detail. TECHNIQUE: Scout and centrally focused pre-contrast axial images of the chest were first obtained, followed by a test bolus of contrast for timing purposes. Thereafter, a full dose of iodinated contrast was administered intravenously by rapid injection, with further multi-slice axial sections acquired in the arterial phase from the thoracic inlet through the lower pelvis.  Coronal maximal intensity projection images were created for comprehensive analysis and diagnosis of the regional circulation. All CT scans at  [facility] and [facility] are performed using radiation dose optimization techniques as appropriate to a performed exam, including but not limited to one or more of the following: automatic exposure control, adjustment of the mA and/or kV according to patient size, use of iterative reconstruction technique. In addition, our institution participates in a radiation dose monitoring program to optimize patient radiation exposure. FINDINGS: THORAX: Non-vascular: Bilateral subsegmental atelectasis. 6 mm groundglass nodule in right upper lobe (8/20). Patulous lower esophagus, concerning for aspiration. Heart: Normal size. No pericardial effusion. Heavy coronary artery calcifications. Thoracic Aorta: No evidence of aneurysm or dissection.  No significant stenosis or occlusion of major arch vessels. Noncalcified atherosclerotic plaques along the descending thoracic aorta. Evaluation of aortic root is limited due to respiratory and cardiac motion blur. Pulmonary vessels: Normal anatomy. No evidence of obstruction or central pulmonary embolism.  ABDOMEN AND PELVIS: Nonvascular: Stable hypodense lesion measuring 12 mm in the liver (6/76). Prostatomegaly Abdominal aorta: No evidence of aneurysm dissection, significant stenosis, or occlusion. Patent bifurcated infrarenal aortic stent with coronal extensions into bilateral common iliac arteries. Coil embolization noted on the prior noted endoleak. No endoleak and current scan. 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 Celiac trunk and branches: No evidence of aneurysm, significant stenosis, or occlusion. Superior mesenteric trunk and branches: No evidence of aneurysm, significant stenosis, or occlusion. Similar eccentric atherosclerotic plaque at the proximal SMA (13/83)  causing ~<50% stenosis. Inferior mesenteric trunk and branches: Apparent origin occlusion, reconstituted distally by visceral collaterals. Renal arteries: No evidence of aneurysm, significant stenosis, or occlusion. Both renal arteries are patent. Iliac arteries: No evidence of aneurysm, significant stenosis, or occlusion. Scattered calcified and noncalcified plaques. 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 Visualized femoral arteries: No evidence of aneurysm, significant stenosis, or occlusion.  ADDITIONAL COMMENTS: None.   1.  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 No evidence of endoleak. Thrombosed sac diameter measuring 5.9 mm. 2.  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 3.  Prostatomegaly. 4.  Atherosclerotic changes and thoracoabdominal aorta and its branches.  ECG 12 lead Result Date: 12/26/2023 Ventricular Rate                   71        BPM                 Atrial Rate                        71        BPM                 P-R Interval                       162       ms                  QRS Duration                       86        ms                  Q-T Interval  354       ms                  QTC Calculation Bazett             384       ms                  Calculated P Axis                  41        degrees             Calculated R Axis                  -16       degrees             Calculated T Axis                  -25       degrees             Sinus rhythm Inferior infarct , age undetermined Nonspecific T wave abnormality When compared with ECG of 26-Dec-2023 01 39, No significant change Confirmed by Rojelio Dunnings  [phone]  on 12-26-2023 7 59 37 AM  ECG 12 lead Result Date: 12/26/2023 Ventricular Rate                   88        BPM                 Atrial Rate                        88        BPM                 P-R Interval                        192       ms                  QRS Duration                       90        ms                  Q-T Interval                       326       ms                  QTC Calculation Bazett             394       ms                  Calculated P Axis                  55        degrees             Calculated R Axis                  -2        degrees             Calculated T Axis                  -  7        degrees             Sinus rhythm Cannot rule out Anterior infarct , age undetermined When compared with ECG of 25-Dec-2023 10 39, No significant change Confirmed by Rojelio Dunnings  [phone]  on 12-26-2023 7 52 20 AM  XR Chest 2 Views Result Date: 12/25/2023 XR CHEST 2 VIEWS, 12/25/2023 10:53 AM INDICATION: chest pain COMPARISON: 12/29/2020 FINDINGS: Cardiovascular: Cardiac silhouette and pulmonary vasculature are within normal limits. Mediastinum: Within normal limits. Lungs/pleura: Clear. No effusion or pneumothorax. Upper abdomen: Visualized portions are unremarkable. Chest wall/osseous structures: Polyarticular degenerative changes.   There is no evidence of acute cardiac or pulmonary abnormality.   ECG 12 lead Result Date: 12/25/2023 Ventricular Rate                   70        BPM                 Atrial Rate                        70        BPM                 P-R Interval                       176       ms                  QRS Duration                       88        ms                  Q-T Interval                       356       ms                  QTC Calculation Bazett             384       ms                  Calculated P Axis                  57        degrees             Calculated R Axis                  0         degrees             Calculated T Axis                  3         degrees             Sinus rhythm Normal ECG When compared with ECG of 01-Jun-2022 15 35, Nonspecific T wave abnormality now evident in anterior leads Confirmed by Launie Stanford  [phone]  on 12-25-2023 10 49 03 AM     Patient Instructions:  Lawrence Sandoval was given explicit instructions on medications, diet, activity levels, and woundcare:   he Follow move in the tubeT instructions:  Follow instructions on the Open Heart Surgery Path to Recovery Sheetmove in the tubeT instructions Do not lift or push outside of the tube if it causes pain Take at least 4 short walks per day Shower daily and clean all wounds with antibacterial soap he is to notify his provider if he feels his sternum is unstable at any time.  he is to check his temperature daily and notify his provider for a temperature greater than 101.5.  he isto monitor his pulse daily and notify his provider of a pulse rate greater than 110 bpm or less than 60 bpm.  he is to monitor his blood pressure daily and notify his provider of a systolic pressure greater than140 mmHg or less than 80 mmHg.  he is to weigh his self daily and notify his provider if he gains over 3 pounds in one day or 5 pounds in 1 week.  he is instructed to quit smoking; assistance in smokingcessation was made available to him.  he is instructed to use the incentive spirometer 4 times each day performing 10 inhalations each time. he is to start a daily walking regimen which willresult in at least one mile a day of walking within the next 3-4 weeks and resume all other activities as tolerated.  he is instructed not to use ointments, creams, or lotions on the incisions.  heis instructed to call if there is redness, drainage, foul-smelling, cloudy, brown or green drainage or opening of any of his incisions  he is instructed to continue his prescribed diet, a cardiac diet,and add additional protein and caloric supplements to promote would healing.  he is instructed to take all his medications as prescribed below. Pt was also instructed to bring all hismedications upon return to the clinic.  Lawrence Sandoval demonstrated understanding of these instructions and agreed to follow  them.  Medication Instructions:         Discharge Medication List as of 01/01/2024 11:44 AM     CONTINUE these medications which have CHANGED   Details  pravastatin  (PRAVACHOL ) 80 mg tablet Take 1 tablet (80 mg total) by mouth daily., Starting Tue 01/01/2024, Until Wed 12/31/2024, Normal        Discharge Medication List as of 01/01/2024 11:44 AM     CONTINUE these medications which have NOT CHANGED   Details  acetaminophen  (TYLENOL ) 500 mg tablet Take 1,000 mg by mouth as needed (pain)., Starting Thu 11/27/2019, Historical Med    aspirin  81 mg EC tablet Take 81 mg by mouth daily., Starting Thu 11/27/2019, Historical Med    chlorthalidone  (HYGROTON ) 25 mg tablet Take 12.5 mg by mouth daily., Starting Mon 05/01/2022, Historical Med    lidocaine  (LIDODERM ) 5 % patch Apply 1 patch topically See Admin Instructions. PLEASE SEE ATTACHED FOR DETAILED DIRECTIONS, Starting Fri 02/09/2023, Historical Med    methIMAzole  (TAPAZOLE ) 5 mg tablet Take 0.5 tablets (2.5 mg total) by mouth daily., Starting Wed 09/26/2023, Normal    mirabegron (Myrbetriq) 50 mg Tb24 Take 1 tablet (50 mg total) by mouth daily., Starting Thu 11/15/2023, Normal    multivitamin with folic acid 400 mcg tab Take 1 tablet by mouth daily. On hold  2/4 for surgery 03/30/23, Starting Sat 12/25/2020, Historical Med    oxyCODONE  (ROXICODONE ) 5 mg immediate release tablet Take 5 mg by mouth 2 (two) times a day as needed., Historical Med    pantoprazole  (PROTONIX ) 40 mg EC tablet Take 1 tablet (40 mg total) by mouth daily., Starting Tue 01/01/2024, Normal  Discharge Medication List as of 01/01/2024 11:44 AM     START taking these medications   Details  clopidogreL (PLAVIX) 75 mg tablet Take 1 tablet (75 mg total) by mouth daily. Take until gone then stop, Starting Wed 01/02/2024, Until Tue 04/01/2024, Normal    metoprolol  succinate (TOPROL  XL) 25 mg 24 hr tablet Take 1 tablet (25 mg total) by mouth daily., Starting Wed  01/02/2024, Until Sat 12/27/2024, Normal    polyethylene glycol (GLYCOLAX) 17 gram packet Take 17 g by mouth daily as needed for constipation., Starting Tue 01/01/2024, Until Tue 01/15/2024 at 2359, OTC        Discharge Medication List as of 01/01/2024 11:44 AM     STOP taking these medications     carvediloL  (COREG ) 12.5 mg tablet Comments:  Reason for Stopping:       omeprazole (PriLOSEC) 40 mg DR capsule Comments:  Reason for Stopping:          Scheduled Future Appointments       Provider Department Dept Phone Center   01/30/2024 10:45 AM HP CT SURGERY APPS [facility]  - Cardiothoracic Surgery [phone]    03/13/2024 9:00 AM Leotis Sella [facility]  - Cardiology [place] [phone] [facility] [address] [facility]   09/09/2024 2:30 PM Debby Isla Manner [facility]  - Cardiology [place] [phone] [facility] [address]       After your follow up appointment all medication refills will need to go to your PCP or cardiologist   25 minutes were spent providing discharge services.  Cordella Glendia Gaskins, PA-C, Cardiothoracic Surgery  Cordella Glendia Gaskins, PA-C 01/01/2024 3:05 PM

## 2024-01-07 ENCOUNTER — Other Ambulatory Visit

## 2024-01-11 NOTE — Progress Notes (Signed)
 This encounter was conducted via the [employer] providing interactive audio and visual communication.  The patient provided verbal consent to conduct a virtual appointment.  The patient was located at their primary residence during this encounter.  Referring Physician(s): Ulanda Ober, MD   Chief Complaint: The patient is seen in virtual video follow up today s/p endoleak repair 01/11/23 at [facility]   History of present illness:  HPI from last clinic visit 06/18/23 Lawrence Sandoval is a 68 y.o. 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 Gaile New.  At the time of the operation, the aortic aneurysm sac measured up to 5.0 cm.  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  He remains asymptomatic.  No back pain.  He had been following up with Dr. New until recently converting his care to the [facility] system.    We discussed the rationale for endoleak repair, the basic steps and periprocedural expectations in addition to the risks and benefits. He was agreeable to everything we discussed and he underwent a technically successful coil and liquid embolization of the endoleak 01/11/23 at [facility]. He tolerated the procedure well and was discharged home the same day.  He presents today to the IR outpatient clinic for follow up. He reports doing very well since the procedure. No left groin or lower extremity pain. No abdominal pain. He had gone back to work. We discussed obtaining a triple phase CTA abdomen/pelvis (stent graft protocol) in two months with clinic visit shortly thereafter. Since our last visit he has been to the OR for an incarcerated sliding left inguinal hernia and ventral hernia and is s/p robotic repair  03/30/23.   A follow up  CTA demonstrated stable aneurysm sac size with probable small persistent endoleak in the superior aspect of the aneurysm. He was asymptomatic and we discussed repeat CTA abdomen/pelvis in 6 month with IR clinic visit shortly thereafter. He was hospitalized 12/25/23-01/01/24 for NSTEMI and underwent CABG x2. A CTA abdomen/pelvis was obtained 12/25/23 during his hospitalization.   Lawrence Sandoval presents today via virtual video visit for follow up.   Past Medical History:  Diagnosis Date   AAA (abdominal aortic aneurysm)    Herniated lumbar intervertebral disc    Hyperlipidemia    Hypertension    Hyperthyroidism    Lumbago    Sleep apnea     Past Surgical History:  Procedure Laterality Date   ABDOMINAL AORTIC ENDOVASCULAR STENT GRAFT N/A 06/06/2019   Procedure: ABDOMINAL AORTIC ENDOVASCULAR STENT GRAFT COIL WITH RIGHT Hypogastric artery;  Surgeon: New Gaile ORN, MD;  Location: [facility] OR;  Service: Vascular;  Laterality: N/A;   HAND SURGERY Left 1995   Hand and Arm injury.    HERNIA REPAIR     IR RADIOLOGIST EVAL & MGMT  12/06/2022   IR RADIOLOGIST EVAL & MGMT  02/01/2023   IR RADIOLOGIST EVAL & MGMT  06/18/2023   IR US  GUIDE VASC ACCESS RIGHT  04/03/2019   IR US  GUIDE VASC ACCESS RIGHT  07/02/2019   ULTRASOUND GUIDANCE FOR VASCULAR ACCESS Bilateral 06/06/2019   Procedure: Ultrasound Guidance For Vascular Access, bilateral femoral arteries;  Surgeon: New Gaile ORN, MD;  Location: [facility] OR;  Service: Vascular;  Laterality: Bilateral;    Allergies: Gabapentin  Medications: Prior to Admission medications   Medication Sig Start Date End Date Taking? Authorizing Provider  acetaminophen  (TYLENOL ) 500 MG tablet Take 1,000 mg by mouth every 6 (six) hours as needed for mild pain.    [provider]  aspirin  EC 81 MG tablet Take 1 tablet (81 mg total) by mouth daily. 06/07/19   Gerome Maurilio HERO, PA-C  carvedilol  (COREG ) 12.5 MG tablet Take 1 tablet (12.5 mg total) by  mouth 2 (two) times daily with a meal. 05/10/21   Lonni Slain, MD  chlorthalidone  (HYGROTON ) 25 MG tablet Take 0.5 tablets (12.5 mg total) by mouth daily. Please schedule appointment for refills. 05/01/22   Lonni Slain, MD  lidocaine  (LIDODERM ) 5 % 2 patches daily. 04/29/21   [provider]  Loratadine-Pseudoephedrine (CLARITIN-D 24 HOUR PO) Take by mouth as needed.    [provider]  methimazole  (TAPAZOLE ) 5 MG tablet Take 5 mg by mouth every other day.    [provider]  Multiple Vitamin (QUINTABS) TABS Take 1 tablet by mouth daily.    [provider]  omeprazole (PRILOSEC) 40 MG capsule Take 40 mg by mouth daily.  04/16/17   [provider]  oxyCODONE  (OXY IR/ROXICODONE ) 5 MG immediate release tablet Take 5 mg by mouth 2 (two) times daily as needed. 03/16/22   [provider]  pravastatin  (PRAVACHOL ) 40 MG tablet Take 40 mg by mouth at bedtime.     [provider]  Pseudoeph-Doxylamine-DM-APAP (NYQUIL MULTI-SYMPTOM PO) Take 30 mLs by mouth at bedtime as needed (Cold).    [provider]     Family History  Problem Relation Age of Onset   Heart disease Mother        CHF   Alcohol abuse Father    Hypertension Father    Stroke Maternal Grandfather     Social History   Socioeconomic History   Marital status: Single    Spouse name: Not on file   Number of children: Not on file   Years of education: Not on file   Highest education level: Not on file  Occupational History   Not on file  Tobacco Use   Smoking status: Former    Current packs/day: 0.00    Types: Cigarettes    Start date: 02/18/1968    Quit date: 02/18/2003    Years since quitting: 20.9    Passive exposure: Never   Smokeless tobacco: Never  Vaping Use   Vaping status: Never Used  Substance and Sexual Activity   Alcohol use: No    Comment: quit abusing alcohol about 10 years ago   Drug use: No   Sexual activity: Not on  file  Other Topics Concern   Not on file  Social History Narrative   Not on file   Social Drivers of Health   Financial Resource Strain: Not on file  Food Insecurity: Low Risk  (12/26/2023)   Received from [facility]   Hunger Vital Sign    Within the past 12 months, you worried that your food would run out before you got money to buy more: Never true    Within the past 12 months, the food you bought just didn't last and you didn't have money to get more. : Never true  Transportation Needs: No Transportation Needs (12/26/2023)   Received from [employer]    In the past 12 months, has lack of reliable transportation kept you from medical appointments, meetings, work or from getting  things needed for daily living? : No  Physical Activity: Not on file  Stress: Not on file  Social Connections: Not on file     Vital Signs: There were no vitals taken for this visit.  Physical Exam  Patient is alert, oriented and able to participate fully in the conversation. No apparent discomfort or distress observed. He appears appropriately dressed.    Imaging:  CTA AP   Type II endoleak arising from bilateral L4 lumbar arteries, with prominent left iliolumbar artery arising from the proximal internal iliac artery.  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     IR endoleak repair (01/11/23)     CTA AP 06/16/23  Probable persistent small endoleak in superior aspect, stable aneurysm sac size at 5.8 cm.  Study lacking non-contrast phase.  Labs:  CBC: No results for input(s): WBC, HGB, HCT, PLT in the last 8760 hours.  COAGS: No results for input(s): INR, APTT in the last 8760 hours.  BMP: No results for input(s): NA, K, CL, CO2, GLUCOSE, BUN, CALCIUM, CREATININE, GFRNONAA, GFRAA in the last 8760 hours.  Invalid input(s): CMP  LIVER FUNCTION TESTS: No results for input(s): BILITOT, AST, ALT, ALKPHOS, PROT,  ALBUMIN in the last 8760 hours.  Assessment and Plan:  69 year old male with history of abdominal aortic and right internal iliac artery aneurysms status post aortobiiliac endograft repair and right internal iliac artery coiling in 2021 with gradual persistent aneurysm sac enlargement over the past 4 years to 5.0 cm from 5.8 cm due to a type II endoleak arising from the L4 vertebral arteries. He underwent a successful coil and liquid embolization of the endoleak 01/11/23 at [facility].   Ester Sides, MD Pager: [phone]    I spent a total of 25 Minutes in virtual video clinical consultation, greater than 50% of which was counseling/coordinating care for endoleak

## 2024-01-14 ENCOUNTER — Ambulatory Visit
Admission: RE | Admit: 2024-01-14 | Discharge: 2024-01-14 | Disposition: A | Discharge status: Home / Self Care | Source: Ambulatory Visit | Attending: Interventional Radiology | Admitting: Interventional Radiology

## 2024-01-14 DIAGNOSIS — I9789 Other postprocedural complications and disorders of the circulatory system, not elsewhere classified: Secondary | ICD-10-CM

## 2024-01-14 HISTORY — PX: IR RADIOLOGIST EVAL & MGMT: IMG5224
